# Patient Record
Sex: Male | Born: 1935 | Race: White | Hispanic: No | Marital: Married
Health system: Southern US, Community
[De-identification: ages and names within clinical notes are randomized; demographics above are authoritative.]

---

## 2010-11-25 ENCOUNTER — Ambulatory Visit: Payer: Self-pay | Admitting: Family Medicine

## 2011-08-13 ENCOUNTER — Inpatient Hospital Stay: Payer: Self-pay | Admitting: Internal Medicine

## 2011-08-13 LAB — COMPREHENSIVE METABOLIC PANEL
Albumin: 2.7 g/dL — ABNORMAL LOW (ref 3.4–5.0)
Anion Gap: 9 (ref 7–16)
BUN: 62 mg/dL — ABNORMAL HIGH (ref 7–18)
Calcium, Total: 8.4 mg/dL — ABNORMAL LOW (ref 8.5–10.1)
Chloride: 93 mmol/L — ABNORMAL LOW (ref 98–107)
EGFR (African American): 25 — ABNORMAL LOW
Glucose: 125 mg/dL — ABNORMAL HIGH (ref 65–99)
Osmolality: 289 (ref 275–301)
Potassium: 3.3 mmol/L — ABNORMAL LOW (ref 3.5–5.1)
SGOT(AST): 16 U/L (ref 15–37)

## 2011-08-13 LAB — URINALYSIS, COMPLETE
Bacteria: NONE SEEN
Blood: NEGATIVE
Ketone: NEGATIVE
Nitrite: NEGATIVE
RBC,UR: <1 /HPF (ref 0–5)
Specific Gravity: 1.015 (ref 1.003–1.030)
Squamous Epithelial: 1

## 2011-08-13 LAB — CBC
HCT: 35.5 % — ABNORMAL LOW (ref 40.0–52.0)
HGB: 11.5 g/dL — ABNORMAL LOW (ref 13.0–18.0)
MCV: 99 fL (ref 80–100)
RDW: 14.1 % (ref 11.5–14.5)
WBC: 10.6 10*3/uL (ref 3.8–10.6)

## 2011-08-13 LAB — PROTIME-INR
INR: 2
Prothrombin Time: 22.7 secs — ABNORMAL HIGH (ref 11.5–14.7)

## 2011-08-13 LAB — CK TOTAL AND CKMB (NOT AT ARMC): CK, Total: 57 U/L (ref 35–232)

## 2011-08-14 LAB — MAGNESIUM: Magnesium: 1.8 mg/dL

## 2011-08-14 LAB — BASIC METABOLIC PANEL
Anion Gap: 6 — ABNORMAL LOW (ref 7–16)
BUN: 43 mg/dL — ABNORMAL HIGH (ref 7–18)
Calcium, Total: 8.8 mg/dL (ref 8.5–10.1)
Chloride: 101 mmol/L (ref 98–107)
Co2: 30 mmol/L (ref 21–32)
Creatinine: 1.67 mg/dL — ABNORMAL HIGH (ref 0.60–1.30)
Osmolality: 284 (ref 275–301)
Potassium: 4 mmol/L (ref 3.5–5.1)

## 2012-02-28 ENCOUNTER — Emergency Department: Payer: Self-pay | Admitting: Unknown Physician Specialty

## 2012-02-28 LAB — CBC
HCT: 37.5 % — ABNORMAL LOW (ref 40.0–52.0)
Platelet: 210 10*3/uL (ref 150–440)
RBC: 3.92 10*6/uL — ABNORMAL LOW (ref 4.40–5.90)
RDW: 14.2 % (ref 11.5–14.5)
WBC: 8.5 10*3/uL (ref 3.8–10.6)

## 2012-02-28 LAB — BASIC METABOLIC PANEL
Anion Gap: 8 (ref 7–16)
Calcium, Total: 8.8 mg/dL (ref 8.5–10.1)
Chloride: 109 mmol/L — ABNORMAL HIGH (ref 98–107)
Co2: 24 mmol/L (ref 21–32)
Osmolality: 287 (ref 275–301)
Potassium: 3.6 mmol/L (ref 3.5–5.1)

## 2012-02-29 LAB — HEPATIC FUNCTION PANEL A (ARMC)
Bilirubin, Direct: 0.2 mg/dL (ref 0.00–0.20)
Bilirubin,Total: 0.5 mg/dL (ref 0.2–1.0)
SGOT(AST): 40 U/L — ABNORMAL HIGH (ref 15–37)

## 2012-02-29 LAB — MAGNESIUM: Magnesium: 1.5 mg/dL — ABNORMAL LOW

## 2012-02-29 LAB — PRO B NATRIURETIC PEPTIDE: B-Type Natriuretic Peptide: 1521 pg/mL — ABNORMAL HIGH (ref 0–450)

## 2012-02-29 LAB — PROTIME-INR: INR: 1.8

## 2012-03-21 ENCOUNTER — Emergency Department: Payer: Self-pay | Admitting: Emergency Medicine

## 2012-03-21 LAB — BASIC METABOLIC PANEL
Anion Gap: 4 — ABNORMAL LOW (ref 7–16)
BUN: 28 mg/dL — ABNORMAL HIGH (ref 7–18)
Chloride: 104 mmol/L (ref 98–107)
Co2: 29 mmol/L (ref 21–32)
Creatinine: 1.34 mg/dL — ABNORMAL HIGH (ref 0.60–1.30)
EGFR (Non-African Amer.): 51 — ABNORMAL LOW
Glucose: 103 mg/dL — ABNORMAL HIGH (ref 65–99)
Osmolality: 280 (ref 275–301)
Sodium: 137 mmol/L (ref 136–145)

## 2012-03-21 LAB — CBC
HCT: 44.4 % (ref 40.0–52.0)
MCV: 95 fL (ref 80–100)
Platelet: 294 10*3/uL (ref 150–440)
RBC: 4.69 10*6/uL (ref 4.40–5.90)
RDW: 13.9 % (ref 11.5–14.5)
WBC: 13 10*3/uL — ABNORMAL HIGH (ref 3.8–10.6)

## 2012-03-21 LAB — TROPONIN I: Troponin-I: 0.02 ng/mL

## 2012-03-21 LAB — CK TOTAL AND CKMB (NOT AT ARMC): CK, Total: 69 U/L (ref 35–232)

## 2012-03-22 LAB — PROTIME-INR
INR: 1.6
Prothrombin Time: 19.4 secs — ABNORMAL HIGH (ref 11.5–14.7)

## 2012-03-22 LAB — PRO B NATRIURETIC PEPTIDE: B-Type Natriuretic Peptide: 539 pg/mL — ABNORMAL HIGH (ref 0–450)

## 2012-03-28 ENCOUNTER — Ambulatory Visit: Payer: Self-pay | Admitting: Internal Medicine

## 2012-03-28 LAB — PROTIME-INR: INR: 1.1

## 2012-03-28 LAB — CK TOTAL AND CKMB (NOT AT ARMC): CK, Total: 42 U/L (ref 35–232)

## 2012-03-29 LAB — BASIC METABOLIC PANEL
Anion Gap: 10 (ref 7–16)
BUN: 22 mg/dL — ABNORMAL HIGH (ref 7–18)
Chloride: 104 mmol/L (ref 98–107)
Co2: 25 mmol/L (ref 21–32)
Creatinine: 0.95 mg/dL (ref 0.60–1.30)
EGFR (African American): >60
EGFR (Non-African Amer.): >60
Osmolality: 281 (ref 275–301)
Potassium: 3.8 mmol/L (ref 3.5–5.1)

## 2012-05-18 ENCOUNTER — Inpatient Hospital Stay: Payer: Self-pay | Admitting: Internal Medicine

## 2012-05-18 LAB — PROTIME-INR
INR: 1.8
Prothrombin Time: 20.5 secs — ABNORMAL HIGH (ref 11.5–14.7)

## 2012-05-18 LAB — URINALYSIS, COMPLETE
Bilirubin,UR: NEGATIVE
Glucose,UR: NEGATIVE mg/dL (ref 0–75)
Ketone: NEGATIVE
Nitrite: NEGATIVE
Ph: 6 (ref 4.5–8.0)
Protein: NEGATIVE
RBC,UR: 2 /HPF (ref 0–5)

## 2012-05-18 LAB — CBC
HGB: 14.4 g/dL (ref 13.0–18.0)
MCHC: 32.8 g/dL (ref 32.0–36.0)
Platelet: 241 10*3/uL (ref 150–440)
RDW: 15.2 % — ABNORMAL HIGH (ref 11.5–14.5)

## 2012-05-18 LAB — LIPASE, BLOOD: Lipase: 376 U/L (ref 73–393)

## 2012-05-18 LAB — COMPREHENSIVE METABOLIC PANEL
Alkaline Phosphatase: 114 U/L (ref 50–136)
EGFR (African American): >60
EGFR (Non-African Amer.): >60
Glucose: 108 mg/dL — ABNORMAL HIGH (ref 65–99)
Osmolality: 286 (ref 275–301)
SGOT(AST): 52 U/L — ABNORMAL HIGH (ref 15–37)
SGPT (ALT): 42 U/L (ref 12–78)
Sodium: 138 mmol/L (ref 136–145)
Total Protein: 8.1 g/dL (ref 6.4–8.2)

## 2012-05-18 LAB — CK TOTAL AND CKMB (NOT AT ARMC): CK-MB: 5.1 ng/mL — ABNORMAL HIGH (ref 0.5–3.6)

## 2012-05-18 LAB — APTT: Activated PTT: 36.7 secs — ABNORMAL HIGH (ref 23.6–35.9)

## 2012-05-18 LAB — CK: CK, Total: 190 U/L (ref 35–232)

## 2012-05-18 LAB — TROPONIN I: Troponin-I: 0.04 ng/mL

## 2012-05-18 LAB — PRO B NATRIURETIC PEPTIDE: B-Type Natriuretic Peptide: 753 pg/mL — ABNORMAL HIGH (ref 0–450)

## 2012-05-19 LAB — CBC WITH DIFFERENTIAL/PLATELET
Eosinophil %: 2.1 %
HCT: 43 % (ref 40.0–52.0)
HGB: 14.6 g/dL (ref 13.0–18.0)
Lymphocyte #: 1.8 10*3/uL (ref 1.0–3.6)
Lymphocyte %: 18.1 %
MCH: 31.4 pg (ref 26.0–34.0)
MCHC: 33.9 g/dL (ref 32.0–36.0)
Monocyte #: 0.9 x10 3/mm (ref 0.2–1.0)
Monocyte %: 9.6 %
Neutrophil #: 6.8 10*3/uL — ABNORMAL HIGH (ref 1.4–6.5)
Neutrophil %: 69.4 %
RBC: 4.64 10*6/uL (ref 4.40–5.90)
RDW: 15 % — ABNORMAL HIGH (ref 11.5–14.5)
WBC: 9.7 10*3/uL (ref 3.8–10.6)

## 2012-05-19 LAB — BASIC METABOLIC PANEL
BUN: 36 mg/dL — ABNORMAL HIGH (ref 7–18)
Chloride: 102 mmol/L (ref 98–107)
Osmolality: 283 (ref 275–301)
Potassium: 4.1 mmol/L (ref 3.5–5.1)

## 2012-05-20 LAB — URINE CULTURE

## 2012-06-06 ENCOUNTER — Inpatient Hospital Stay: Payer: Self-pay | Admitting: Internal Medicine

## 2012-06-06 LAB — COMPREHENSIVE METABOLIC PANEL
Albumin: 4 g/dL (ref 3.4–5.0)
BUN: 41 mg/dL — ABNORMAL HIGH (ref 7–18)
Calcium, Total: 9.2 mg/dL (ref 8.5–10.1)
Creatinine: 1.56 mg/dL — ABNORMAL HIGH (ref 0.60–1.30)
EGFR (African American): 49 — ABNORMAL LOW
EGFR (Non-African Amer.): 43 — ABNORMAL LOW
Glucose: 66 mg/dL (ref 65–99)
Potassium: 4.6 mmol/L (ref 3.5–5.1)
SGOT(AST): 42 U/L — ABNORMAL HIGH (ref 15–37)
SGPT (ALT): 35 U/L (ref 12–78)
Sodium: 133 mmol/L — ABNORMAL LOW (ref 136–145)

## 2012-06-06 LAB — PROTIME-INR: Prothrombin Time: 17.7 secs — ABNORMAL HIGH (ref 11.5–14.7)

## 2012-06-06 LAB — CBC WITH DIFFERENTIAL/PLATELET
Basophil #: 0.1 10*3/uL (ref 0.0–0.1)
Basophil %: 0.9 %
Eosinophil %: 0.8 %
HCT: 40.9 % (ref 40.0–52.0)
Lymphocyte #: 1.3 10*3/uL (ref 1.0–3.6)
Lymphocyte %: 13.3 %
MCH: 29.9 pg (ref 26.0–34.0)
Monocyte %: 8.3 %
Neutrophil %: 76.7 %
Platelet: 205 10*3/uL (ref 150–440)
RBC: 4.48 10*6/uL (ref 4.40–5.90)
RDW: 15.1 % — ABNORMAL HIGH (ref 11.5–14.5)
WBC: 10 10*3/uL (ref 3.8–10.6)

## 2012-06-06 LAB — CBC
HCT: 43.5 % (ref 40.0–52.0)
MCH: 29.4 pg (ref 26.0–34.0)
RBC: 4.78 10*6/uL (ref 4.40–5.90)
RDW: 14.7 % — ABNORMAL HIGH (ref 11.5–14.5)
WBC: 9.4 10*3/uL (ref 3.8–10.6)

## 2012-06-06 LAB — TSH: Thyroid Stimulating Horm: 5.24 u[IU]/mL — ABNORMAL HIGH

## 2012-06-06 LAB — URINALYSIS, COMPLETE
Blood: NEGATIVE
Ketone: NEGATIVE
Leukocyte Esterase: NEGATIVE
Ph: 7 (ref 4.5–8.0)
Protein: NEGATIVE
Squamous Epithelial: NONE SEEN

## 2012-06-06 LAB — APTT
Activated PTT: 33.7 secs (ref 23.6–35.9)
Activated PTT: 37.6 secs — ABNORMAL HIGH (ref 23.6–35.9)

## 2012-06-06 LAB — TROPONIN I
Troponin-I: 0.03 ng/mL
Troponin-I: 0.04 ng/mL

## 2012-06-06 LAB — MAGNESIUM: Magnesium: 1.8 mg/dL

## 2012-06-07 LAB — CBC WITH DIFFERENTIAL/PLATELET
Basophil #: 0.1 10*3/uL (ref 0.0–0.1)
Basophil %: 0.8 %
Eosinophil #: 0.3 10*3/uL (ref 0.0–0.7)
Eosinophil %: 2.8 %
HGB: 13 g/dL (ref 13.0–18.0)
MCV: 92 fL (ref 80–100)
Neutrophil #: 6.5 10*3/uL (ref 1.4–6.5)
Neutrophil %: 68.3 %
RBC: 4.37 10*6/uL — ABNORMAL LOW (ref 4.40–5.90)
RDW: 15 % — ABNORMAL HIGH (ref 11.5–14.5)
WBC: 9.5 10*3/uL (ref 3.8–10.6)

## 2012-06-07 LAB — COMPREHENSIVE METABOLIC PANEL
Albumin: 3.5 g/dL (ref 3.4–5.0)
BUN: 43 mg/dL — ABNORMAL HIGH (ref 7–18)
Bilirubin,Total: 1 mg/dL (ref 0.2–1.0)
Chloride: 102 mmol/L (ref 98–107)
Glucose: 92 mg/dL (ref 65–99)
Osmolality: 282 (ref 275–301)
Potassium: 4.6 mmol/L (ref 3.5–5.1)
SGOT(AST): 34 U/L (ref 15–37)
SGPT (ALT): 30 U/L (ref 12–78)
Total Protein: 7.1 g/dL (ref 6.4–8.2)

## 2012-06-07 LAB — LIPID PANEL
Cholesterol: 93 mg/dL (ref 0–200)
Triglycerides: 59 mg/dL (ref 0–200)
VLDL Cholesterol, Calc: 12 mg/dL (ref 5–40)

## 2012-06-07 LAB — TROPONIN I: Troponin-I: 0.04 ng/mL

## 2012-06-07 LAB — SEDIMENTATION RATE: Erythrocyte Sed Rate: 4 mm/hr (ref 0–20)

## 2012-06-08 LAB — BASIC METABOLIC PANEL
Calcium, Total: 9.2 mg/dL (ref 8.5–10.1)
Co2: 28 mmol/L (ref 21–32)
Creatinine: 1.33 mg/dL — ABNORMAL HIGH (ref 0.60–1.30)
Glucose: 95 mg/dL (ref 65–99)
Sodium: 133 mmol/L — ABNORMAL LOW (ref 136–145)

## 2012-06-08 LAB — PROTIME-INR
INR: 1.4
Prothrombin Time: 16.8 secs — ABNORMAL HIGH (ref 11.5–14.7)

## 2014-03-15 IMAGING — CR DG CHEST 2V
1 series · 2 of 2 positions shown · non-contrast
Comparison: none

REASON FOR EXAM: SOB
COMMENTS:

PROCEDURE:     DXR - DXR CHEST PA (OR AP) AND LATERAL  - February 28, 2012 [DATE]
RESULT:     Cardiomegaly. Atelectasis versus pneumonia left lung base. These
findings are new from prior study of 08/13/2011.

[Series 1: w chest pa · 0.14mm/px · 2 of 2 slices shown]
[im 1/2]
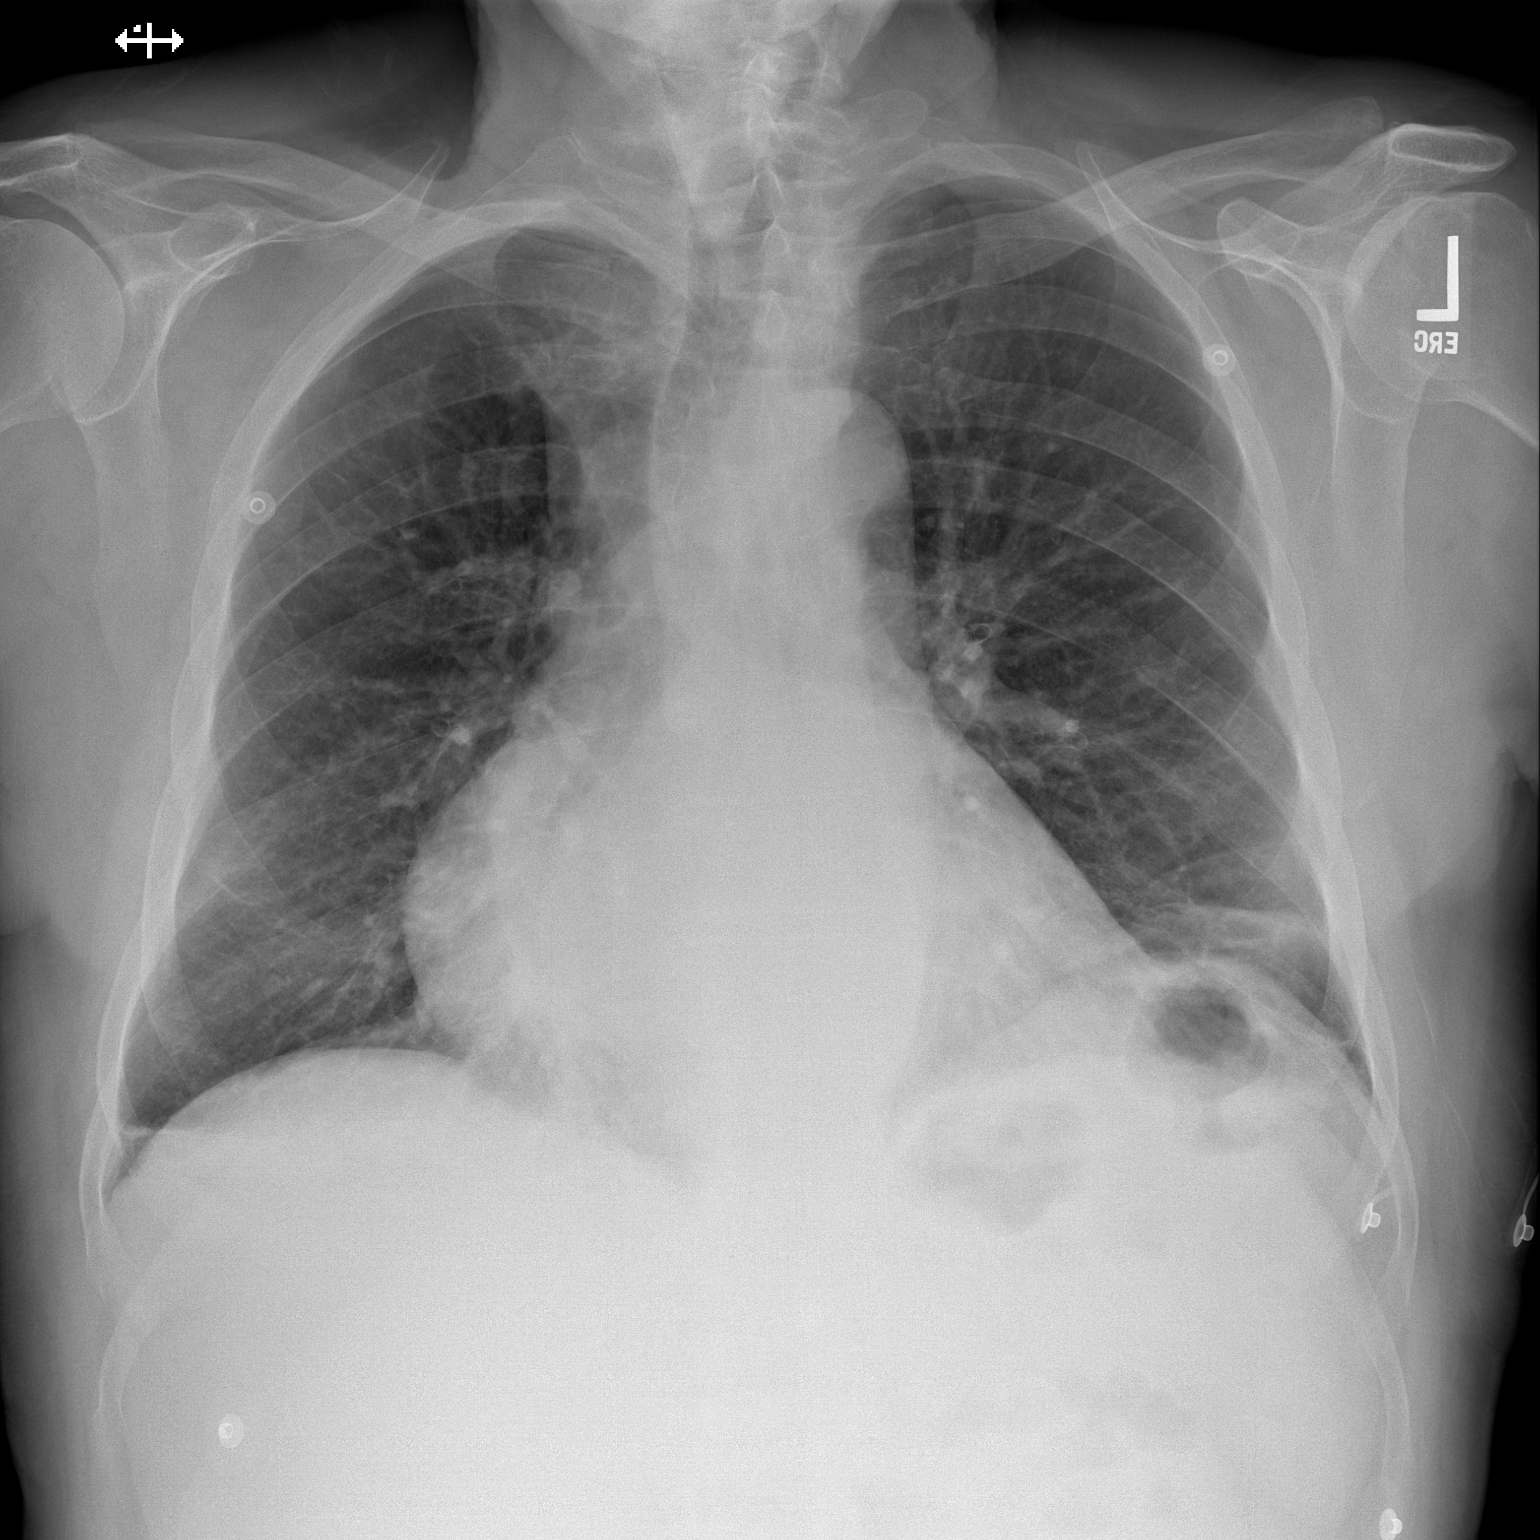
[im 2/2]
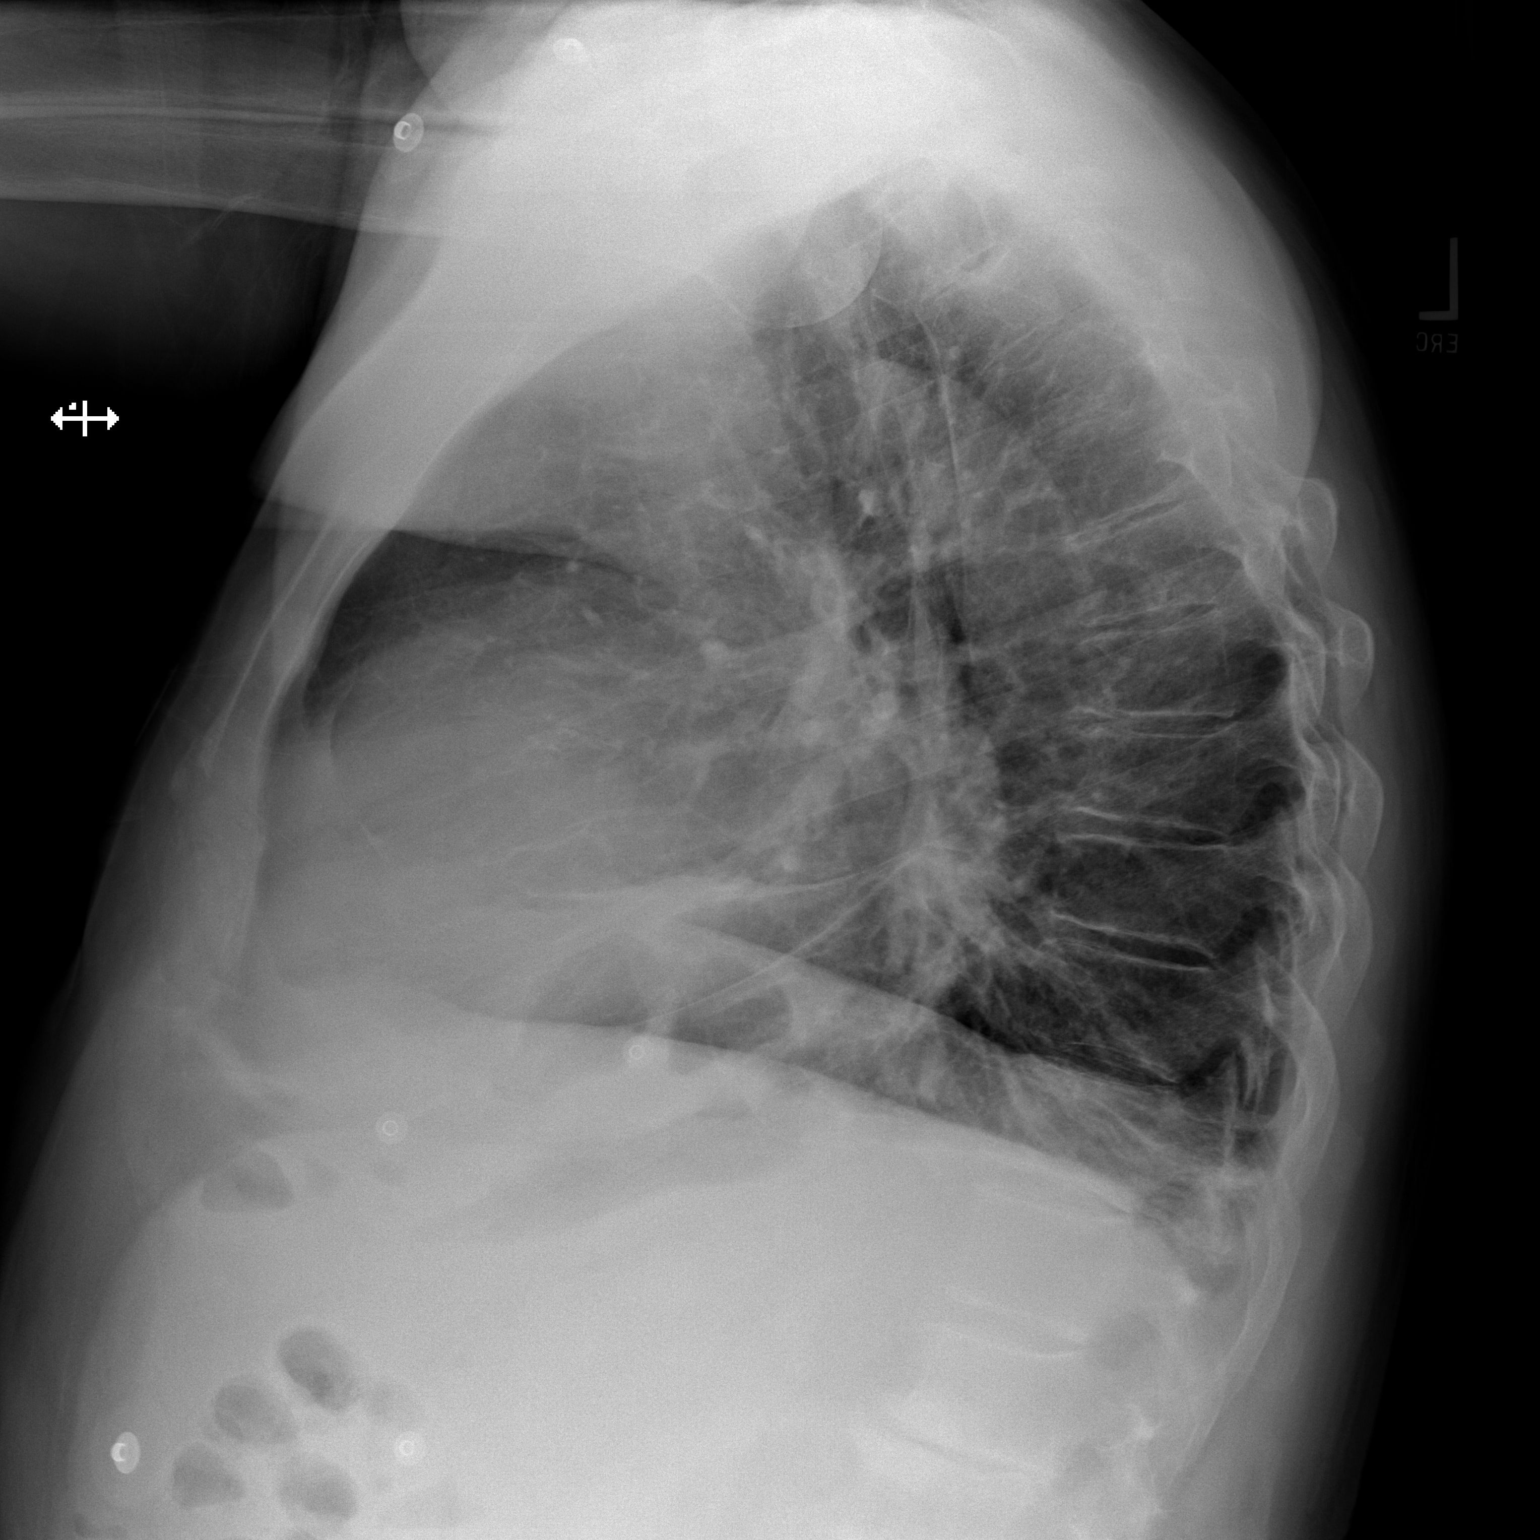

[2 of 2 positions shown; findings below may reference images not displayed]

IMPRESSION: Atelectasis versus pneumonia left lung base.

## 2014-03-16 IMAGING — CT CT CHEST W/ CM
2 series · 15 of 31 positions shown, 19 images · IV contrast (APPLIED)
Comparison: none

REASON FOR EXAM: sob high d dimer
COMMENTS:

[Series 4: soft tissue · axial · 0.77mm/px · z∈[-326,-284]mm · 2 of 94 slices shown]
[im 8/94  mediastinal]
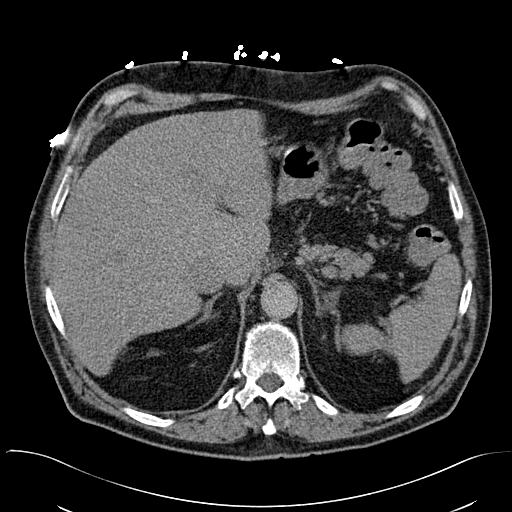
[im 22/94  mediastinal]
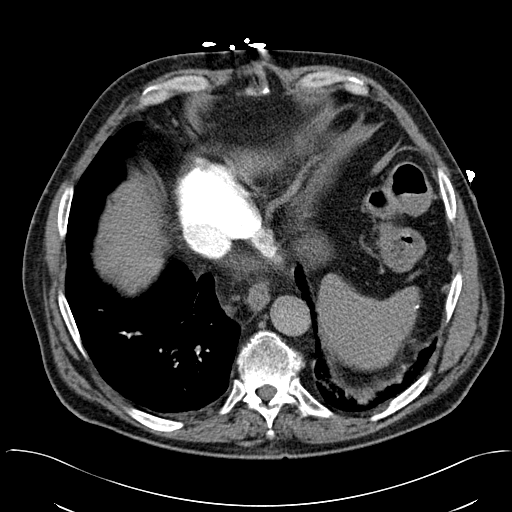

[Series 5: lung windows · axial · 0.77mm/px · z∈[-320,-89]mm · 13 of 92 slices shown, 17 images]
[im 8/92  mediastinal]
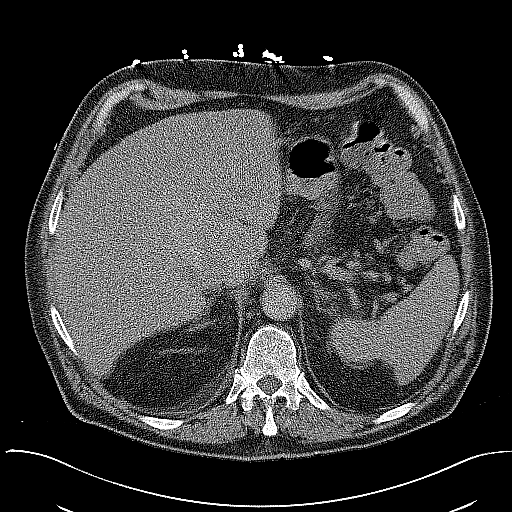
[im 8/92  lung]
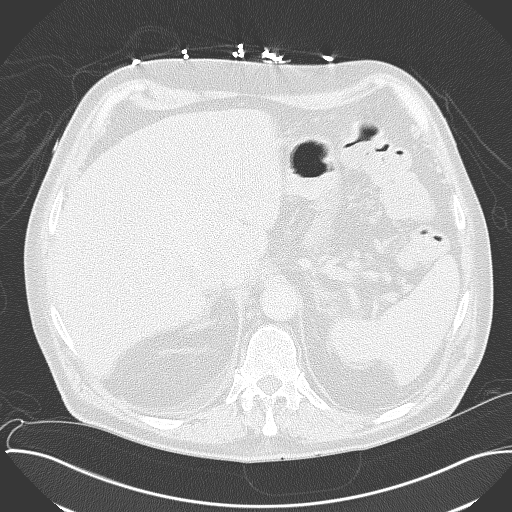
[im 15/92  lung]
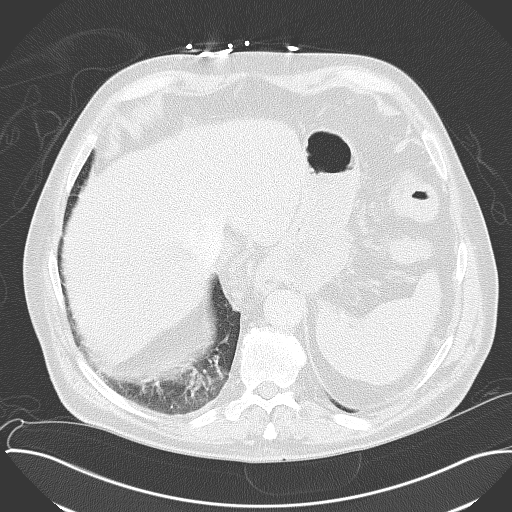
[im 22/92  lung]
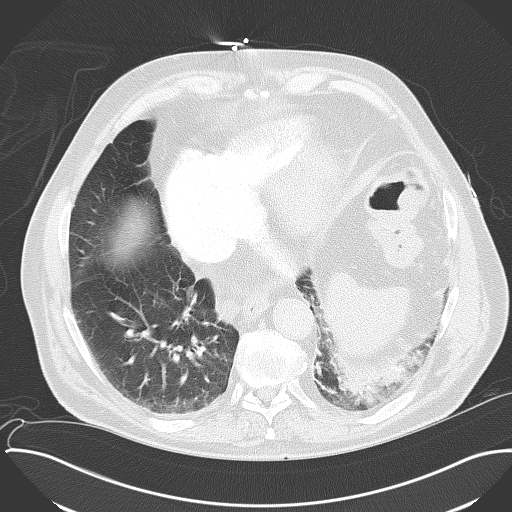
[im 29/92  lung]
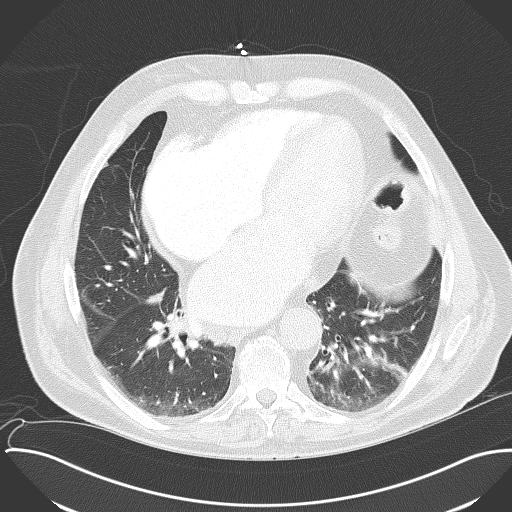
[im 36/92  mediastinal]
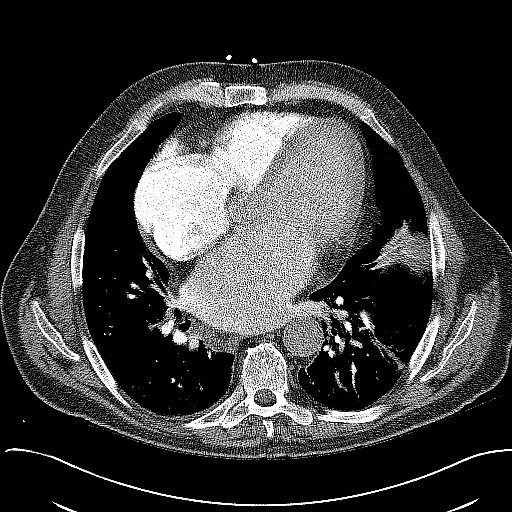
[im 36/92  lung]
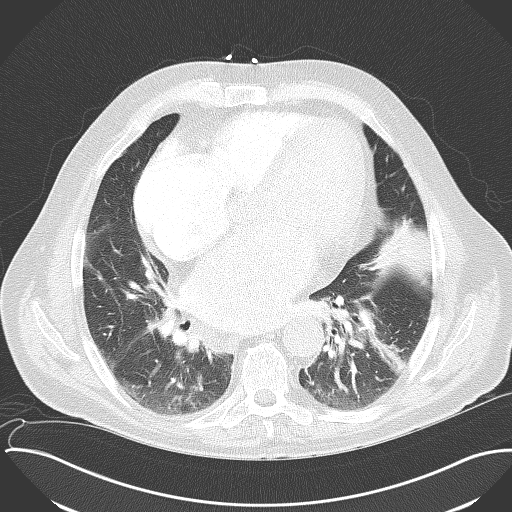
[im 43/92  lung]
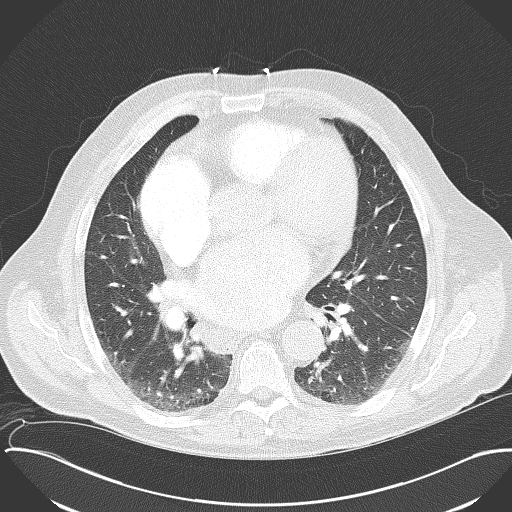
[im 46/92  lung]
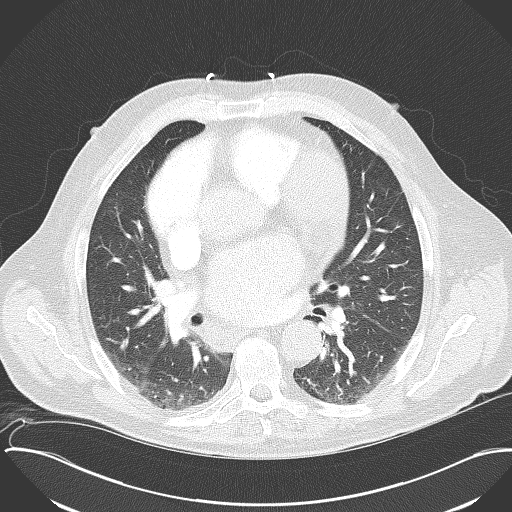
[im 50/92  lung]
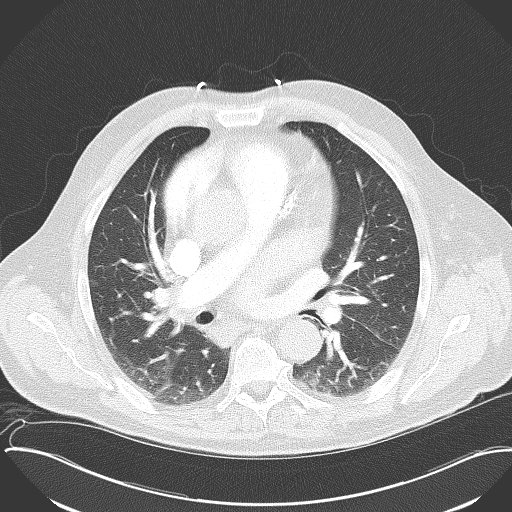
[im 57/92  mediastinal]
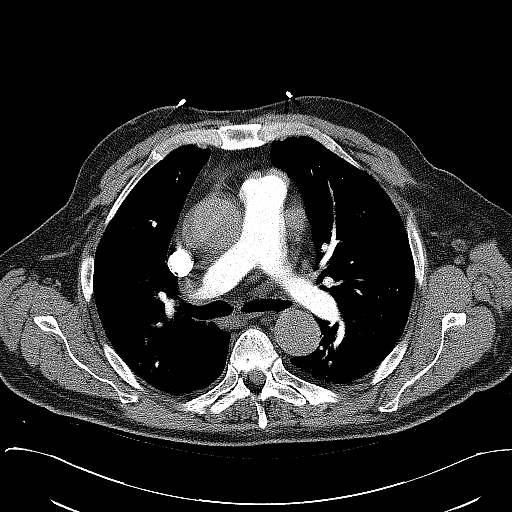
[im 57/92  lung]
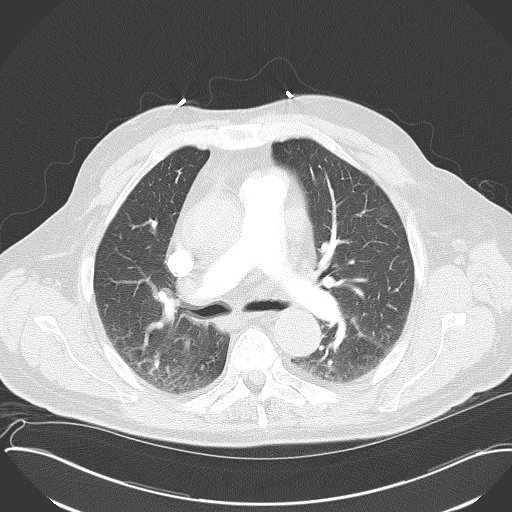
[im 64/92  lung]
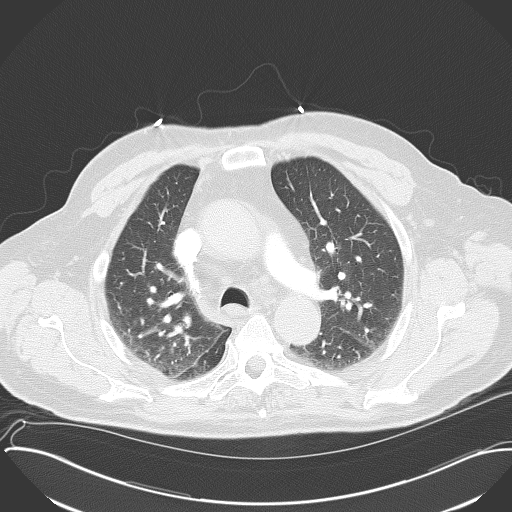
[im 71/92  lung]
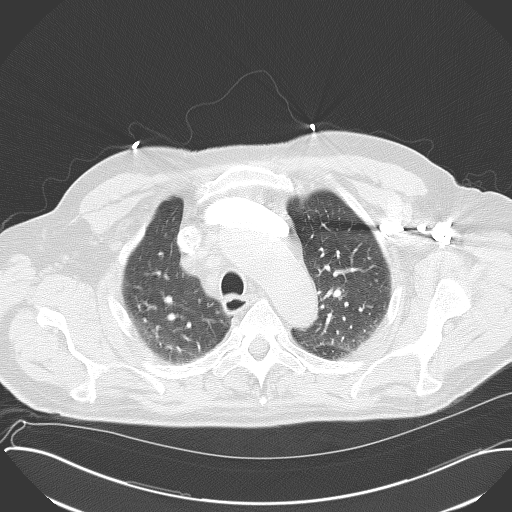
[im 78/92  lung]
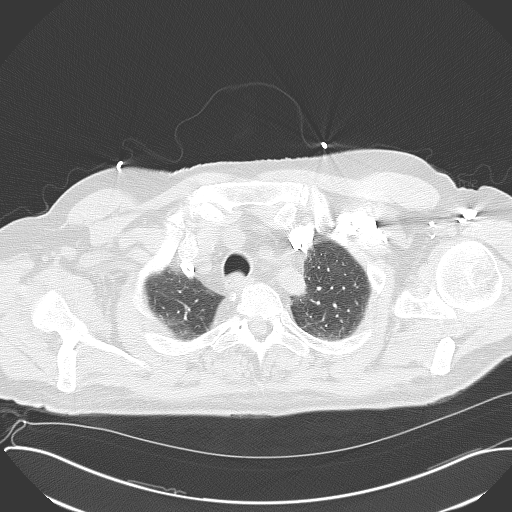
[im 85/92  mediastinal]
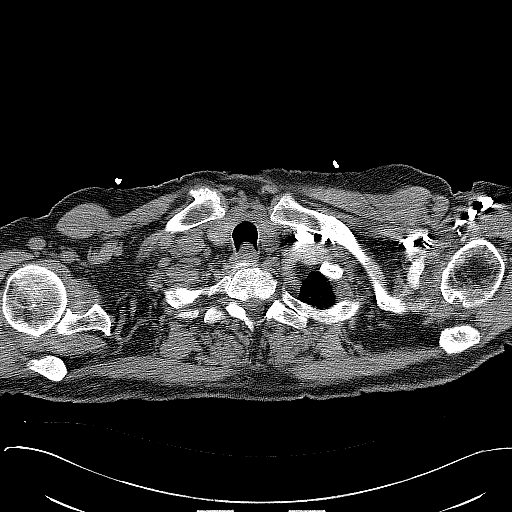
[im 85/92  lung]
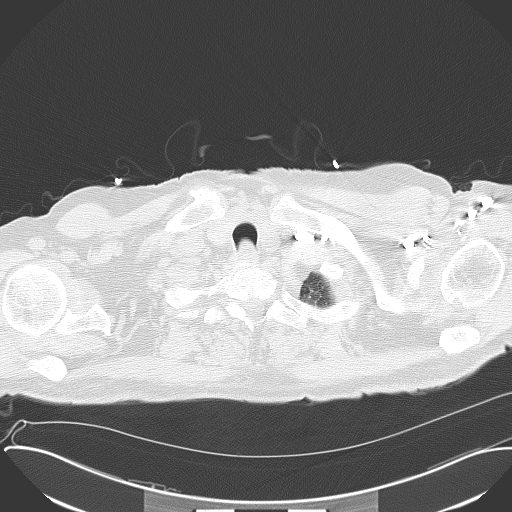

[15 of 31 positions shown; findings below may reference images not displayed]

PROCEDURE:     CT  - CT CHEST (FOR PE) W  - February 29, 2012  [DATE]

RESULT:     Emergent chest CT is performed with 80 mL of Hsovue-JTF
iodinated intravenous contrast with images reconstructed at 3 mm slice
thickness in the axial plane. The patient has no previous exam for
comparison.

Images are degraded somewhat by respiratory motion artifact in the lower
lungs. There is evidence of early bronchiectasis in the lower lobe regions
with some mild peribronchial thickening suggestive of bronchitis. Some areas
of consolidation in the lower lobes could represent atelectasis. Infiltrate
is felt to be somewhat less likely. The heart is enlarged with prominent
biatrial enlargement. There is excellent opacification of the pulmonary
arteries without evidence of pulmonary emboli. The thoracic aorta is normal
in caliber. Patchy groundglass areas of density are seen in the lungs which
could represent atelectasis or edema. Small hiatal hernia is present.
Granulomatous calcification is seen within the spleen. The adrenal glands
appear unremarkable.
IMPRESSION: 1. No pulmonary emboli evident.
2. Cardiomegaly.
3. Bilateral lung base areas of atelectasis with mild areas of possible
edema.
4. Small hiatal hernia.
5. Splenic granulomatous calcifications.

[REDACTED]

## 2014-04-06 IMAGING — CR DG CHEST 2V
1 series · 2 of 2 positions shown · non-contrast
Comparison: none

REASON FOR EXAM: SOB
COMMENTS:

PROCEDURE:     DXR - DXR CHEST PA (OR AP) AND LATERAL  - March 21, 2012 [DATE]
RESULT:     Comparison: 02/28/2012

[Series 1: w chest pa · 0.14mm/px · 2 of 2 slices shown]
[im 1/2]
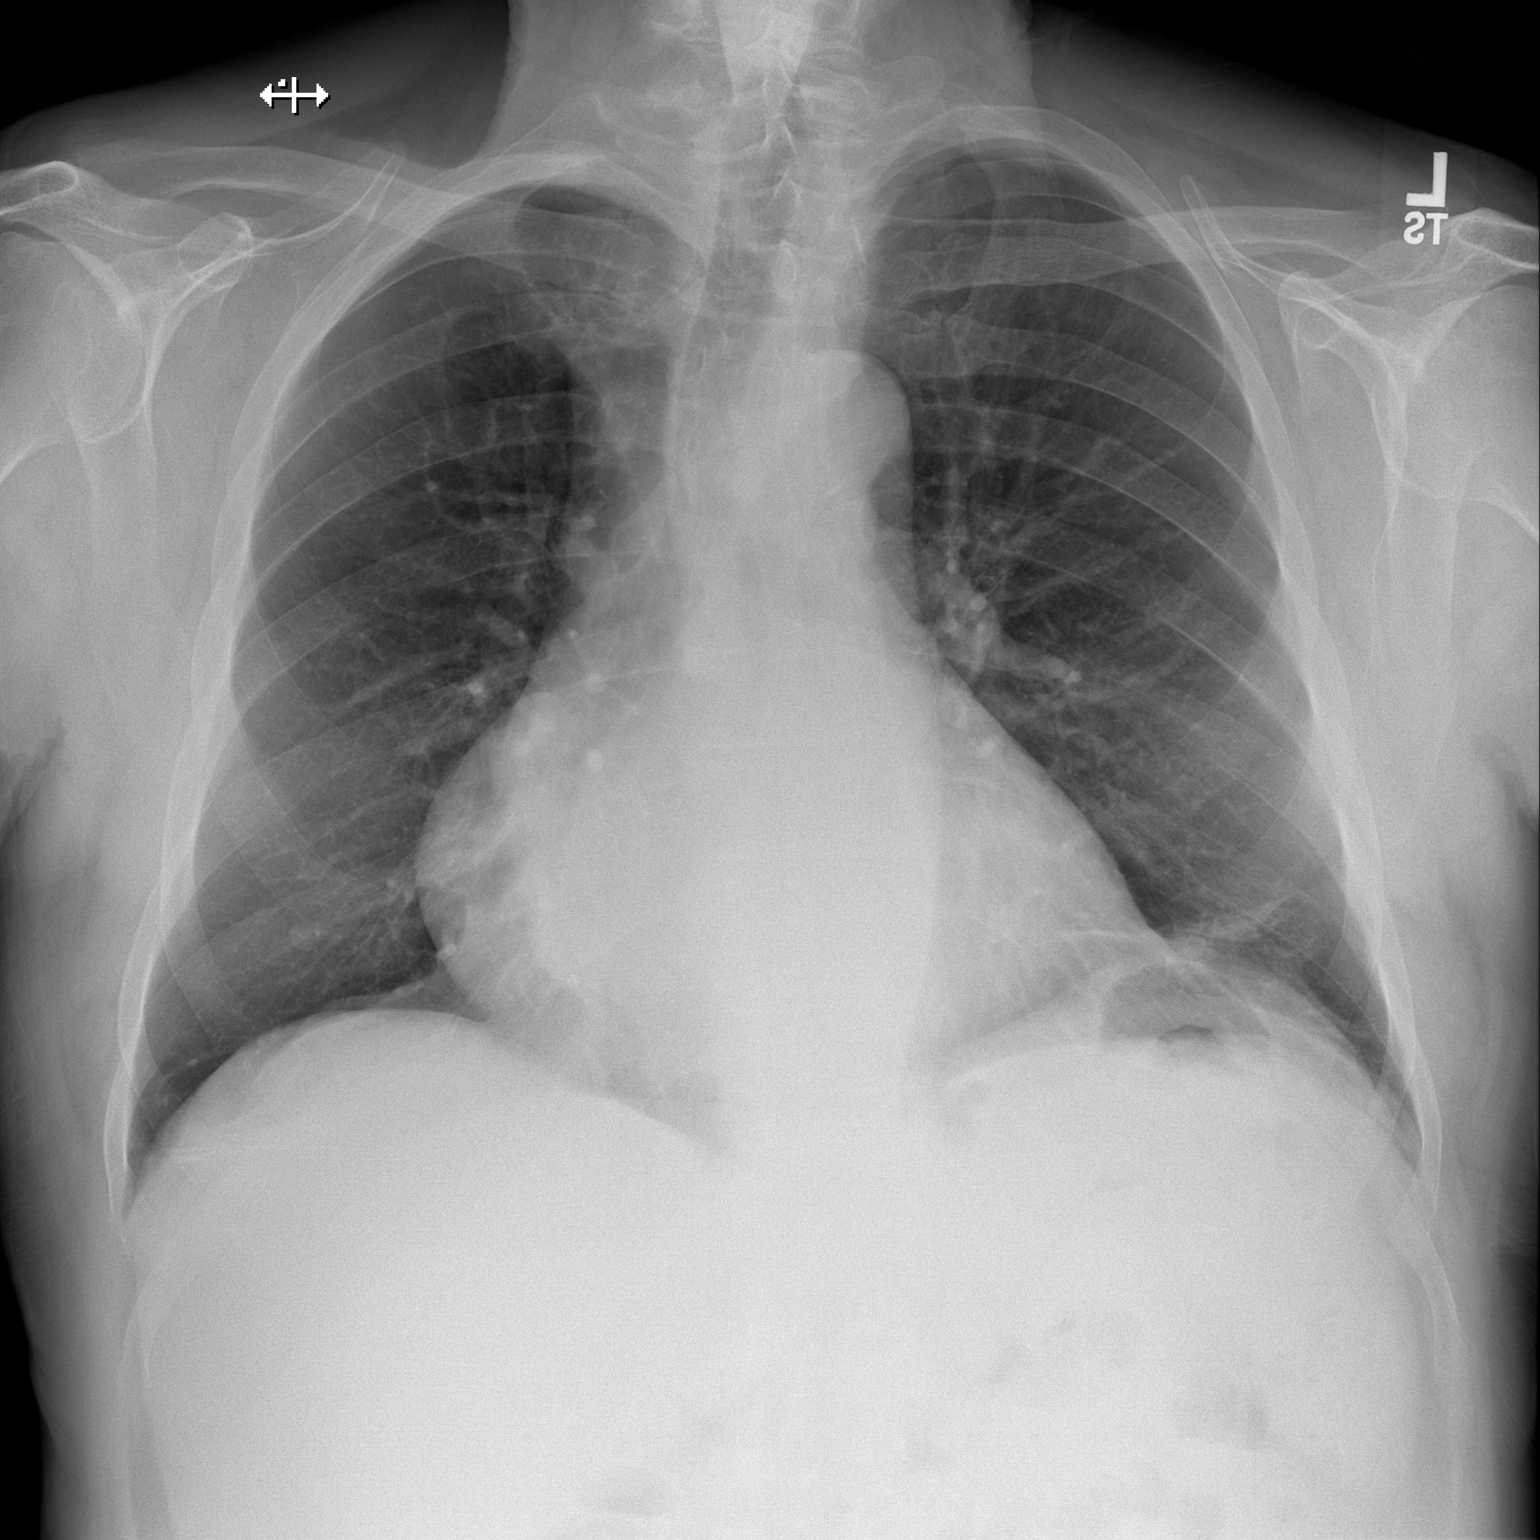
[im 2/2]
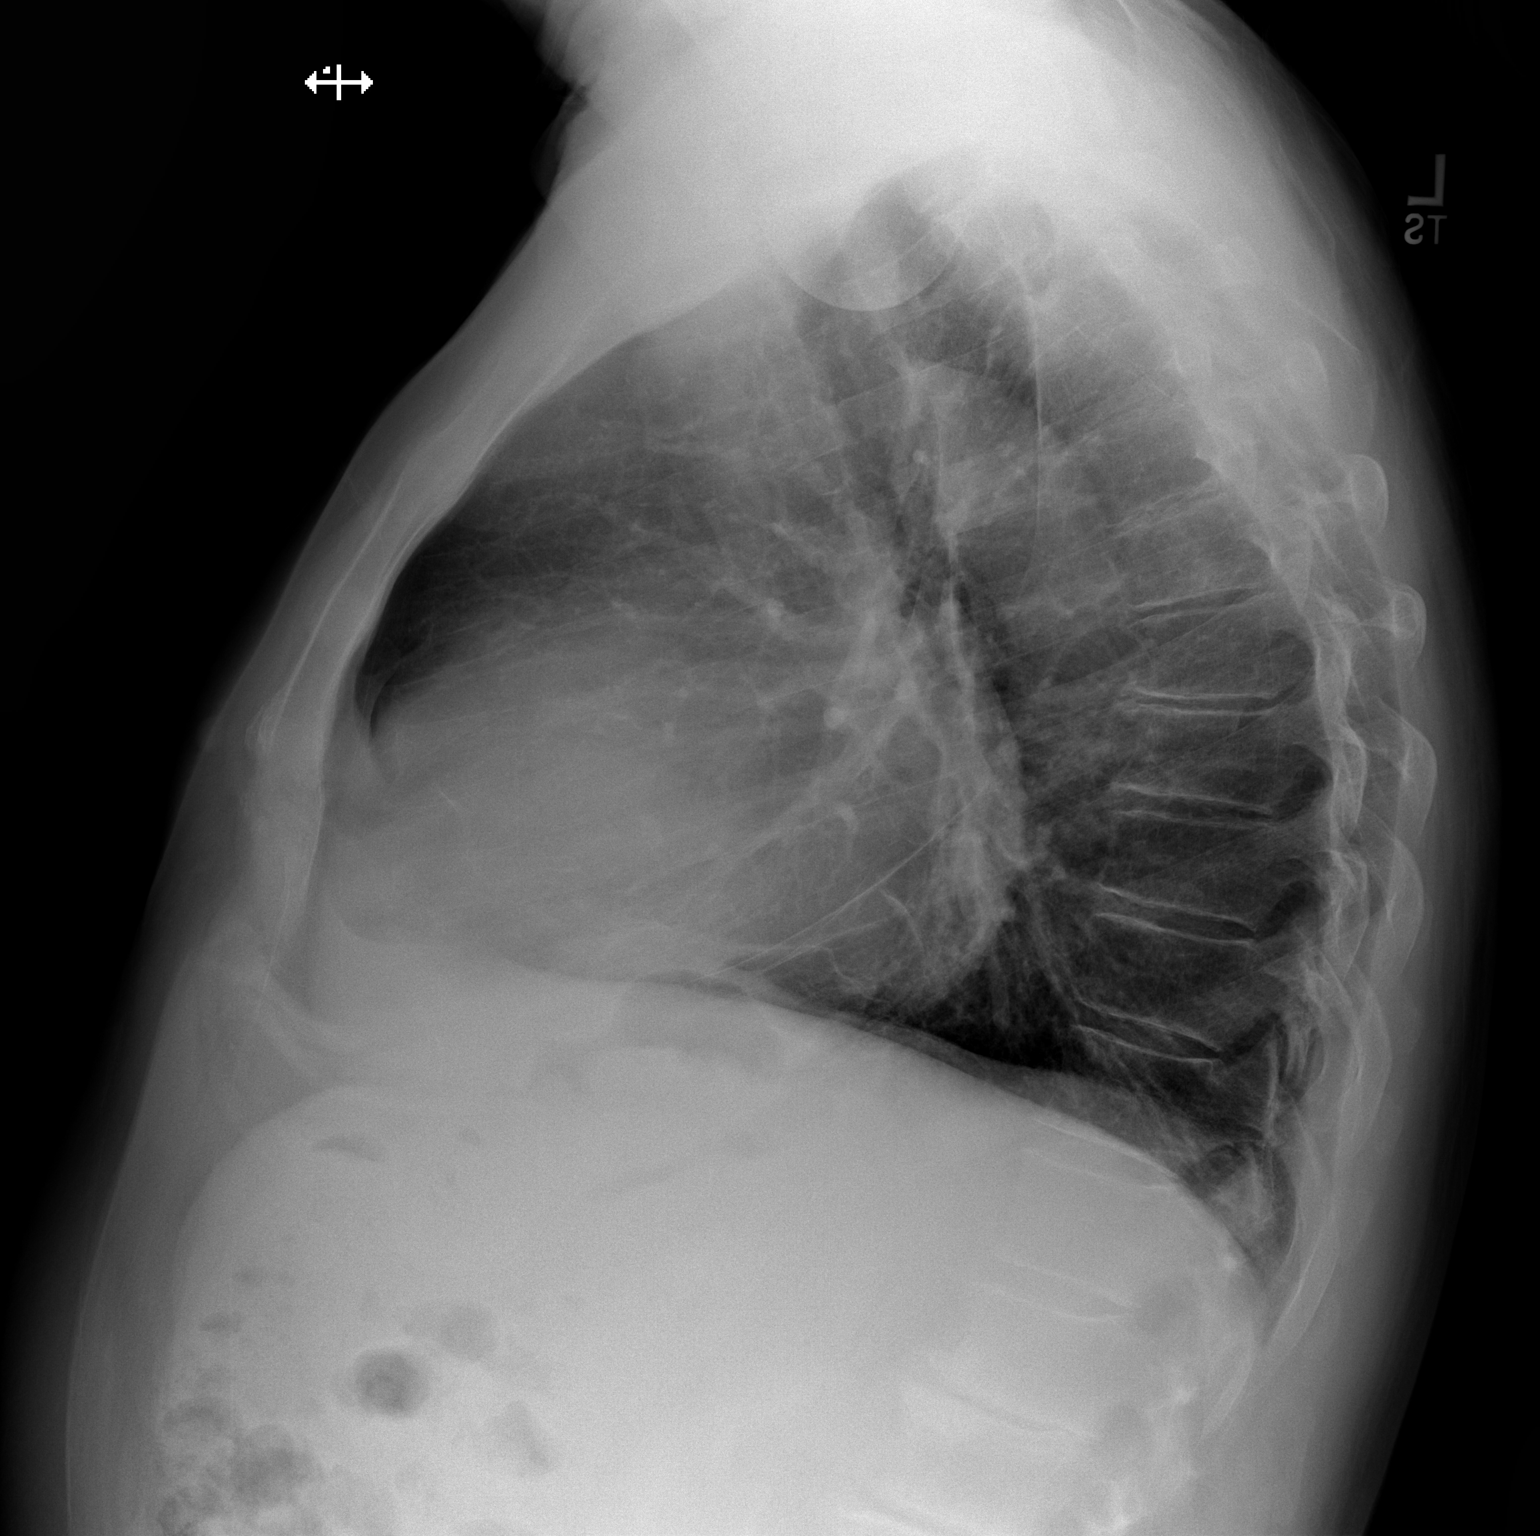

[2 of 2 positions shown; findings below may reference images not displayed]

FINDINGS: Cardiomegaly and the mediastinum are similar to prior. Minimal linear
opacities at the left lung base are likely secondary to atelectasis or
scarring, similar to prior. Small, symmetric nodular densities overlying the
lower lungs are felt to represent nipple shadows.
IMPRESSION: No acute cardiopulmonary disease.

[REDACTED]

## 2014-06-21 NOTE — Discharge Summary (Signed)
PATIENT NAME:  Tristan Woods, Tristan Woods MR#:  [id number] DATE OF BIRTH:  February 05, 1935  DATE OF ADMISSION:  03/28/2012 DATE OF DISCHARGE:  03/29/2012  DISCHARGE DIAGNOSES: 1. Moderate congestive heart failure.  2. Coronary artery disease.  3. Stable angina.  4. Hypertension.   HISTORY: This is a 80 year old male with new onset of congestive heart failure and pressure in his chest consistent with angina.  The patient had a stress test showing inferior myocardial perfusion defect and myocardial ischemia. He was placed on appropriate medication management, including metoprolol for cardiomyopathy and Lasix as well. The patient underwent cardiac catheterization showing moderate LV systolic dysfunction with normal pulmonary pressures and normal pulmonary capillary wedge pressures but significant distal right coronary artery stenosis consistent with inferior myocardial ischemia.  The patient underwent PCI and stent placement of a Bare Metal Stent  due to the fact that the patient has had atrial fibrillation and will need triple therapy. The patient has had no further significant symptoms. The patient is ambulating quite well after his procedure without complications. He was discharged home in good condition with followup in 2 weeks.    DISCHARGE MEDICATIONS: 1. Metoprolol 25 mg twice per day.  2. Aspirin 25 mg p.o. daily.  3. Coumadin 4 mg each day. 4. Plavix 75 mg each day.   FOLLOWUP: In 2 weeks.  ____________________________ Lamar BlinksBruce J. Harjot Dibello, MD bjk:cb D: 03/29/2012 11:14:22 ET T: 03/29/2012 11:46:56 ET JOB#: [id number] cc: Lamar BlinksBruce J. Devan Babino, MD, <Dictator> Lamar BlinksBRUCE J Esaw Knippel MD ELECTRONICALLY SIGNED 03/30/2012 13:42

## 2014-06-21 NOTE — Consult Note (Signed)
Discussed clinical findings and plan with Abby PotashKaren Earle, PA and agree with her note from today.  Electronic Signatures: Scot JunElliott, Robert T (MD)  (Signed on 21-Mar-14 17:42)  Authored  Last Updated: 21-Mar-14 17:42 by Scot JunElliott, Robert T (MD)

## 2014-06-21 NOTE — Consult Note (Signed)
PATIENT NAME:  Tristan Woods, STATES Tristan Woods DATE OF BIRTH:  08/12/1934  DATE OF CONSULTATION:  05/18/2012  REFERRING PHYSICIAN:  Dr. Elisabeth Pigeon  CONSULTING PHYSICIAN:  Tristan Shackleton. Daylan Boggess, PA-C  CARDIOLOGIST:  Dr.  Gwen Pounds    REASON FOR CONSULTATION: Heme-positive stool with a patient on Coumadin therapy.   HISTORY OF PRESENT ILLNESS: This is a pleasant 80 year old gentleman who initially presented to the Emergency Department with concerns of worsening shortness of breath and lower extremity edema. Who does have a past medical history significant for recent cardiac stent placement in January 2014. He states that prior to the stent placement, he was having similar symptoms, during which time there was a positive stress test and he went in for cardiac catheter and stent placement. He had been doing well over the past 2 months, up until the past 3 days where he began noticing increasing shortness of breath. Upon further work-up, his CPK was mildly elevated at 6.3 and his BNP was elevated at 753. Troponins and CK were negative. His hemoglobin was stable at 14.4 and his INR was 1.8. He does have a history of atrial fibrillation as well for which he is on Coumadin therapy at home. He is on Plavix for his stent placement. Incidental finding in the Emergency Room was urinary tract infection. He subsequently underwent a rectal exam during which time he was found to be heme-positive. He denies noticing any bright red blood per rectum or melena. He denies any diarrhea or constipation. He denies any abdominal pain rectal pain, nausea or vomiting. He has not noticed any gross bleeding of any kind. He states his last colonoscopy was between 10 and 12 years ago and states that it unremarkable at that time. Currently the patient states his shortness of breath has improved since admission. He has no current symptomatic concerns or complaints. He denies any NSAID use at home. No fever or chills. No chest pain or shortness of  breath.   ALLERGIES: No known drug allergies.   PAST MEDICAL HISTORY: Atrial fibrillation, hypertension, dyslipidemia, gout, congestive heart failure, coronary artery disease and a history of penile cancer, status post surgery and chemoradiation.   PAST SURGICAL HISTORY: Cardiac stent placement recently in January 2014. Also history of a partial penectomy,  status post chemotherapy, radiation and surgery.   SOCIAL HISTORY: The patient denies any alcohol, tobacco or illicit drug use.   FAMILY HISTORY: The patient states his brother had cancer, which he thinks was lung, but is not positive. No known family history of GI malignancy, colon polyps or IBD.   HOME MEDICATIONS: Metoprolol, Coumadin, allopurinol, Plavix, Lasix and atorvastatin.   INPATIENT MEDICATIONS: Pantoprazole, atorvastatin, Rocephin, clopidogrel, Toprol-XL, allopurinol, furosemide.   REVIEW OF SYSTEMS: A 12-system review of systems was obtained on the patient. All pertinent positives are mentioned above and are otherwise negative.   PHYSICAL EXAMINATION:    VITAL SIGNS: Blood pressure 121/78, pulse 108, respirations 20, temperature 97.4, bedside pulse oximetry 95%.  GENERAL: This is a pleasant 80 year old gentleman resting quietly and comfortably in bed accompanied by his son at bedside in no acute distress. Alert and oriented x 3,  HEAD: Atraumatic, normocephalic.  NECK: Supple.  HEENT: Sclerae anicteric. Mucous membranes moist.  PULMONARY: Respirations are even and unlabored, clear to auscultation in bilateral anterior lung fields.  HEART: Irregularly irregular rhythm. S1 and S2 noted.  ABDOMEN: Soft, nontender, nondistended. Normoactive bowel sounds noted in all 4 quadrants. No masses palpated. No guarding or rebound. No organomegaly or  hernias appreciated.  RECTAL: Deferred. He recently had a rectal exam done in the ER that was heme-positive.  EXTREMITIES: Mild lower extremity pitting edema. He currently has stockings  on. 2+ pulses noted bilateral upper extremity. NEUROLOGIC:  Cranial nerves II through XII grossly intact. Alert and oriented x 3.  PSYCHIATRIC:  Appropriate mood and affect.   LABORATORY DATA: White blood cells 9.9, hemoglobin 14.4, hematocrit 43.8, platelets 241, sodium 138, potassium 3.8, BUN 41, creatinine 1.15, glucose 108, troponin 0.04, CPK 6.3, CK 190, BNP 753 PT 20.5, INR 1.8, PTT 36.7, bilirubin 0.6, alkaline phosphatase 114, ALT 42, AST 52, albumin 3.7, lipase 376.   IMAGING DATA: Chest x-ray was obtained on the patient and findings were consistent with atelectasis of the left lung base as well as cardiomegaly. This appeared stable compared to 03/21/2012. There is no acute abnormality noted.   ASSESSMENT: 1.  Acute on chronic heart failure, systolic.  2.  Heme-positive stools.  3.  Urinary tract infection.  4.  Atrial fibrillation, currently on Coumadin therapy.  5.  Coronary artery disease with recent stent placement in January 2014, on Plavix.   PLAN: I have discussed this patient's case in detail with Dr. Lynnae Prudeobert Elliott, who is involved in the development of the patient's plan of care. At this time, his hemoglobin is within normal range and he has not noticed any overt gastrointestinal bleeding; therefore, we are comfortable with the patient resuming his Coumadin. We do recommend that he be maintained on pantoprazole therapy even after discharge for approximately 2 to 3 months. While inpatient,  I will also order a H. pylori blood test. We do recommend keeping a close eye on his hemoglobin and hematocrit throughout hospitalization. However, we would prefer for his cardiopulmonary status to be improved prior to proceeding with any invasive endoscopic intervention. Should he remain, stable, we are comfortable with him following up in the office to discuss undergoing intervention at that time. The above plan was discussed with the patient and patient's son, who verbalized understanding. All  questions were answered.   The above plan of care was discussed and agreed upon under supervisory agreement between myself and Dr. Lynnae Prudeobert Elliott.   Thank you so much of consultation and for allowing us to participate in the patient's plan of care.     ____________________________ Tristan ShackletonKaryn M. Colin BentonEarle, PA-C kme:cc D: 05/18/2012 16:44:34 ET T: 05/18/2012 17:16:39 ET JOB#: [id number]  cc: Tristan ShackletonKaryn M. Kinnedy Mongiello, PA-C, <Dictator> Tristan ShackletonKARYN M Terri Malerba PA ELECTRONICALLY SIGNED 05/19/2012 13:00

## 2014-06-21 NOTE — Discharge Summary (Signed)
PATIENT NAME:  Tristan Woods, Tristan Woods MR#:  [id number] DATE OF BIRTH:  19-Aug-1934  DATE OF ADMISSION:  05/18/2012 DATE OF DISCHARGE:  05/19/2012  DISCHARGE DIAGNOSES:  1. Acute systolic congestive heart failure.  2. Atrial fibrillation.  3. Hypertension. 4. Urinary tract infection. 5. Gastrointestinal bleed, possible polyp.   6. Acute renal insufficiency.   HISTORY OF PRESENTING ILLNESS: A 80 year old male with past medical history of hypertension, chronic atrial fibrillation, coronary artery disease, status post stent placement in January 2014, gout and cancer in the penis, was leading a healthy and active life, who complained of recurrent shortness of breath, and stress test was done, which was positive. As a result, he had cardiac catheterization and stent placed at the end of January, after which he was feeling fine, more short of breath and feeling strong and leading healthy life. Again started feeling short of breath and weak, swelling on the legs is also not going down anymore despite increased dose of Lasix by Dr. Gwen Pounds in the clinic, and so he decided to come to the Emergency Room. BNP was elevated. Stool guaiac on examination was positive for presence of the blood in the stool, as he was on Coumadin.   HOSPITAL COURSE AND STAY:  1. For his acute on chronic systolic heart failure, we advised to do echocardiogram, continued on IV Lasix b.i.d., and cardiology consult with Dr. Gwen Pounds was ordered. He had coronary artery disease and status post stent recently. We continued his cardiac medication while he was in the hospital except Coumadin, because he had questionable GI bleed. His INR level was 1.8, and so for his atrial fibrillation, we just maintained him without Coumadin while in the hospital and then restarted later on the discharge after GI consult. Cardiologist, Dr. Juliann Pares, saw the patient, and he suggested to continue Lasix, but decreased the dose, and add ACE inhibitor or ARB because  echocardiogram showed severely decreased left ventricular function. He advised no cath at this time and agreed with the continued management and can be discharged later on. The patient was feeling comfortable, not short of breath and feeling fine, so we decided to discharge from cardiology point of view.   Other medical issues addressed during this hospital stay:  2. GI bleed.  Dr. Mechele Collin did not want to do colonoscopy because the patient recently had cardiac cath and stent placement done, and so he wanted him to follow in the clinic, but to rule out any big mass in his colon, he did CT scan of the abdomen with oral contrast, which showed some diverticula or questionable polyp, and he advised [place] to continue Coumadin for now and follow in the clinic, and he will do colonoscopy in the future, but the patient definitely needed colonoscopy, and that was make sure that he understands the importance of that while getting discharged, to follow regularly with Dr. Mechele Collin.  3. Hypertension. Continued on Lasix and metoprolol.  4. Urinary tract infection. Urine culture: Gram-negative rods. Continued on Rocephin and discharged on cephalosporin orally.  5. Gout. We continued allopurinol.  6. Gastrointestinal bleeding. As mentioned above, no more GI bleed in the hospital. Hemoglobin stable, and CT of the abdomen showed questionable polyp or nodule and advised to follow for colonoscopy in clinic with Dr. Mechele Collin.  7. Atrial fibrillation. Coumadin was on hold while in the hospital, but then resumed as he needed it and no more GI bleed.  LABORATORY DATA: BNP is 753. Hemoglobin 14.4. Creatinine 1.15. Urinalysis is 98 WBCs, 3+ leukocyte  esterase, gram-negative rods in the urine sensitive to cephalosporin. INR was 1.8. Echocardiogram: Left ventricular ejection fraction less than 20%. Severely decreased global left ventricular systolic function. Multiple segmental Moderately increased left ventricular internal cavity size.  Severely enlarged right ventricle. Severely reduced right ventricular systolic function. Severely dilated left atrium. Severely dilated right atrium. Degenerative mitral valve, severe mitral valve regurgitation. Moderately elevated pulmonary systolic pressure. Moderate to severe tricuspid regurgitation.   CODE STATUS: Full code.   CONDITION ON DISCHARGE: Stable.   MEDICATION ON DISCHARGE: 1. Allopurinol 300 mg once a day.  2. Warfarin 2 mg once a day. 3. Plavix 75 mg once a day.  4. Atorvastatin 20 mg once a day.  5. Metoprolol 25 mg once a day. 6. Cefazolin 500 mg 2 times a day for 5 days.  7. Furosemide 20 mg once a day. 8. Pantoprazole 40 mg once a day. 9. Aldactone 25 mg once a day.  DIET ON DISCHARGE: Low-sodium, low-fat, low-cholesterol diet.   DIET CONSISTENCY: Regular.   TOTAL LIQUIDS: 1 liter per day.  FOLLOWUP: In 3 to 4 weeks with Dr. Mechele CollinElliott. Advised to follow with Dr. Gwen PoundsKowalski in 1 to 2 weeks.  TOTAL TIME SPENT: 45 minutes.    ____________________________ Hope PigeonVaibhavkumar G. Elisabeth PigeonVachhani, MD vgv:OSi D: 05/23/2012 09:11:45 ET T: 05/23/2012 09:36:10 ET JOB#: [id number]  cc: Hope PigeonVaibhavkumar G. Elisabeth PigeonVachhani, MD, <Dictator> Lamar BlinksBruce J. Kowalski, MD Scot Junobert T. Elliott, MD Jorje GuildGlenn R. Beckey DowningWillett, MD Altamese DillingVAIBHAVKUMAR Calleigh Lafontant MD ELECTRONICALLY SIGNED 06/01/2012 14:51

## 2014-06-21 NOTE — Discharge Summary (Signed)
PATIENT NAME:  Tristan Woods, Tristan Woods MR#:  [id number] DATE OF BIRTH:  [birth year]  DATE OF ADMISSION:  06/06/2012 DATE OF DISCHARGE:  06/08/2012  DISCHARGE DIAGNOSES: 1. Pneumonia.  2. Acute respiratory failure due to pneumonia.  3. Chronic systolic congestive heart failure.  4. Hypertension.  5. Atrial fibrillation.  6. Acute renal failure, improving.   CONDITION ON DISCHARGE: Stable.   CODE STATUS ON DISCHARGE: FULL CODE.   DISCHARGE MEDICATIONS: Allopurinol 300 mg oral tablet once a day, Plavix 75 mg oral tablet once a day, atorvastatin 20 mg oral tablet once a day, metoprolol 25 mg oral extended release once a day, pantoprazole 40 mg oral delayed-release tablet once a day, lisinopril 5 mg oral tablet once a day, warfarin 1 mg oral tablet 3 tablets orally once a day, Ceftin 500 mg oral tablet 2 times a day for 4 days, furosemide 20 mg oral tablet take 1 tablet orally once a day when weight is increased by 3 to 4 pounds, otherwise no need to take it.   HOME HEALTH ON DISCHARGE: No.   HOME OXYGEN: No.   DIET ON DISCHARGE: Low sodium, low fat, low cholesterol.  Diet consistency regular.   ACTIVITY LIMITATION: As tolerated.   TIMEFRAME FOR FOLLOW UP: In 1 to 2 weeks with his primary care physician to check INR level and renal function. Need to check INR in next week with primary care physician as Coumadin is increased.     PRIMARY CARE PHYSICIAN: Barry Brunner, MD at [facility].  HISTORY OF PRESENT ILLNESS: The patient is a 80 year old male with past medical history of hypertension, chronic atrial fibrillation, coronary artery disease status post stent placement in January 2014, gout, cancer in the penis, chronic systolic heart failure with ejection fraction of 20%, recent admission to the hospital for that. He said that after he was discharged 2 weeks ago he was slowly getting weaker and started feeling dizzy.  Lately now for the last few days, he  went to see his primary care  physician, and he continued his medication as we advised.  In the morning he woke up, he felt very weak and dizzy.  Whenever  he tried to stand up, he was very short of breath and he had cramps in his legs, so he decided to come to the Emergency Room. On further questioning, he denies any orthopnea, any edema of legs, any cough or palpitations or fever.   HOSPITAL COURSE AND STAY:  1. For acute respiratory failure, he was needing oxygen which he does not use at home.  Possibly they thought it might be pulmonary embolism initially when he was admitted, so we decided to do the deep vein thrombosis study on him which actually reported negative later on.  His V/Q scan was also negative, and so initially he was started on heparin for suspicion of pulmonary embolism, but then it was discontinued.  2. Possible atelectasis versus pneumonia: This was likely a community-acquired pneumonia.  White cell count was normal, but he had some cough.  There was no sputum production. He presented with shortness of breath, and chest x-ray was slightly suggestive of pneumonia, and so he was started on antibiotics for his pneumonia, and he had significant response within the next 2 days to the antibiotics, and he was comfortable breathing on room air.  3. Acute renal failure:  Presented with mild worsening of his kidney function. He was taking Lasix at home. We held it and gave gentle hydration.  His  renal function improved and so we discharged without any Lasix at home.  4. Congestive heart failure, chronic systolic failure:  Which was stable issue, and he did not have any symptoms of  exacerbation of CHF at home; so we stopped his Lasix and spironolactone due to renal failure.  We continued Lisinopril.  5. Atrial fibrillation:  He was taking Coumadin. INR was low.  He was taking 2 mg, so we increased to 3 mg daily and advised to follow his INR  level with his primary care physician within the next week.  6. Coronary artery  disease:  Bare Metal Stent placement.  So we continued aspirin, metoprolol and statin.  7. Hypertension: Continued metoprolol and Lisinopril.  8. Gout: We held it due to renal failure and restarted on discharge.  9. Skin rashes:  He had this complaint since the last 2 months.  I called infectious disease  consult with Dr. Sampson GoonFitzgerald.  He ordered multiple tests.  This is not an active or bothersome some issue, and the patient will follow with him in the clinic.   HOSPITAL CONSULTS:  Dr. Sampson GoonFitzgerald for infectious disease.     LABORATORY AND RADIOLOGICAL DATA:  WBC 9.4, hemoglobin 14, platelet count 215, MCV 91.  BUN 41, creatinine 1.56, potassium 4.6, chloride 99, troponin 0.03, magnesium level 1.8. Thyroid stimulating hormone 524, INR 1.5 on presentation, BNP 794. D-dimer 1.22. Urinalysis was negative.  ABG on presentation:  pH 7.45, pCO2 of 34, pO2 98 on 28% oxygen supplementation. V/Q scan negative, creatinine improved to 1.37 after the IV fluid.   Chest x-ray on presentation was reported as suggestive of subsegmental atelectasis in the left lower lobe, posteriorly in the right middle lobe. There was no evidence of CHF. Doppler study of bilateral lower extremities were negative for any clots. Chest x-ray repeated one on the next day after hydration suggested some low-grade compensated CHF, and there is new atelectasis in the left lung base; and on  repeat, creatinine level came down to 1.33.  Repeat chest x-ray on the 10th of April showed dominant cardiomegaly but no CHF. No acute pulmonary disease noted.   TOTAL TIME SPENT ON THIS DISCHARGE SUMMARY: 45 minutes.  ____________________________ Hope PigeonVaibhavkumar G. Elisabeth PigeonVachhani, MD vgv:cb D: 06/11/2012 21:15:39 ET T: 06/11/2012 22:22:59 ET JOB#: [id number]  cc: Hope PigeonVaibhavkumar G. Elisabeth PigeonVachhani, MD, <Dictator> Jorje GuildGlenn R. Beckey DowningWillett, MD Serita ShellerErnest B. Maryellen PileEason, MD Stann Mainlandavid P. Sampson GoonFitzgerald, MD  Heath GoldVAIBHAVKUMAR [facility] CenterVACHHANI MD ELECTRONICALLY SIGNED 06/13/2012 16:27

## 2014-06-21 NOTE — Consult Note (Signed)
PATIENT NAME:  Tristan Woods, Theophil M MR#:  [id number] DATE OF BIRTH:  Jul 04, 1934  DATE OF CONSULTATION:  06/07/2012  REFERRING PHYSICIAN:  Hope PigeonVaibhavkumar G. Elisabeth PigeonVachhani, MD CONSULTING PHYSICIAN:  Stann Mainlandavid P. Sampson GoonFitzgerald, MD  PRIMARY CARE PHYSICIAN: Jorje GuildGlenn R. Beckey DowningWillett, MD  REASON FOR CONSULTATION: Fever.   HISTORY OF PRESENT ILLNESS: This is a very pleasant 80 year old gentleman with a history of atrial fibrillation, hypertension, CHF, gout and GERD, who has had several recent admissions for CHF exacerbations. He has an EF of 20%. He was readmitted yesterday after dizziness and weakness and shortness of breath. He is being treated currently for possible pneumonia as well as his AFib, CHF and worsening renal failure. We are consulted for evaluation of this rash he has had.   The patient reports that he has slowly developed a rash over the last 3 months. He describes the lesions as starting flat, pink, round lesions that are nonpruritic and nontender except for very mild tenderness that lets him know that he is developing one. They then develop into what sound like blisters with clear fluid clear. The clear fluid drains. The lesions then slowly heal. Associated with this, he has had no fevers, night sweats, chills or weight loss. He reports some weight gain. He has no arthritic symptoms. The lesions are occurring on his lower extremities, upper extremities, chest and trunk. He denies any new medications that he noted associated with the development of this rash. He denies any new lotions or other possible creams or detergents. His only other symptoms have the pulmonary symptoms as well as the lower extremity swelling and the dizziness.   PAST MEDICAL HISTORY:  1. CHF, EF of 20%.  2. Atrial fibrillation.  3. Coronary artery disease, status post bare-metal stent in 2014.  4. Hypertension.  5. Gout.  6. History of penile cancer status post surgery.  7. GERD.  PAST SURGICAL HISTORY:  1. Heart cath.  2. Surgical  removal of penile cancer.   SOCIAL HISTORY: The patient does not smoke or drink. He lives with his wife. No drug use. He retired from [employer]. He has no travel. He has 2 dogs at home. Denies any recent tick bites. No international travel. His daughter and grandchildren live next door, but they are not young. There have been no other sick contacts.   MEDICATIONS: Currently, the patient is on atorvastatin, azithromycin, ceftriaxone, lisinopril, metoprolol, pantoprazole, aspirin, warfarin, Tylenol p.r.n., Flexeril p.r.n., Zofran p.r.n.   ALLERGIES: The patient has no known drug allergies.   REVIEW OF SYSTEMS: Eleven systems reviewed and negative except as per HPI.   PHYSICAL EXAMINATION:  VITALS: T-max since admission is 98.4, pulse 101 to 133, blood pressure 92/47 up to 110/78, pulse oximetry 96% to 98% on 2 liters.  GENERAL: He is an overweight but pleasant interactive gentleman. He is lying in bed with no acute distress except for some mild dyspnea.  HEENT: His pupils are equal, round and reactive to light and accommodation. Extraocular movements are intact. Sclera anicteric. His oropharynx is clear. He has no oral lesions or tonsillomegaly.  NECK: Supple.  HEART: Irregular with distant heart sounds.  LUNGS: Clear to auscultation bilaterally.  ABDOMEN: Obese, but soft, nontender, nondistended.  EXTREMITIES: He does have 1+ bilateral lower extremity edema.  SKIN: He has innumerable 1 to 2 cm, round, slightly raised, salmon-colored to pink-colored lesions on his lower extremities up to his groin as well as his abdomen, his back and his arms. These are slightly blanching. A few of  them have some mild crusts over them. None of them are vesicular in nature at this point, but look like they could have been denuded vesicles. He also has, on his nose, a poorly healed opening that he says has been there for many years. It is like a V-shaped laceration. 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   DATA: White blood count 9.5, hemoglobin 13.0, platelets 189. Monocyte count slightly elevated at 1.2, but eosinophils are 0.3 and normal. Comprehensive panel shows a BUN of 43, creatinine of 1.37, down from 1.56 yesterday. LFTs are within normal limits except for slight elevation in AST to 42 on admission. CK is normal at 148, troponins are negative. TSH slightly elevated at 5.24. D-dimer was positive at 1.72. Urinalysis was unremarkable. Reviewing prior CBCs, I see no elevated eosinophils. Of note, on March 20th, he had a Morganella urine culture, greater than 100,000.   IMAGING: Chest x-ray shows low-grade CHF and new atelectasis at the left lung base.   IMPRESSION:  1. A 80 year old with multiple medical problems admitted for congestive heart failure exacerbation and atrial fibrillation, with 3 months of progressive lesions on his body that are relatively macular in nature, but at some point may be vesicular with draining some clear fluid. He does have a history of shingles. He does not seem to have any other reason for profound immunosuppression, and so I doubt this would be disseminated shingles since that would be very unusual. Also, they are not tender, and he is not febrile. I see no other evidence of systemic infection at this point. Differential is broad, but would include potentially drug reaction, although he denies any new drugs. He has no recent tick bites or other lesions that make me concerned that he has any kind of tickborne or insect-borne illness. I do not feel this is likely an infectious etiology and more likely a dermatological noninfectious condition. However, at this point, we will check an RPR. If his lesions blister, I will swab them for HSV and VZV, although again, I think this is unlikely.  2. I would suggest he be seen by dermatology, although this can likely wait until outpatient. He would likely need biopsy of this lesion.  3. Thank you for the consult. I will  be glad to follow with you.    ____________________________ Stann Mainland. Sampson Goon, MD dpf:OSi D: 06/07/2012 12:33:05 ET T: 06/07/2012 13:21:28 ET JOB#: [id number]  cc: Stann Mainland. Sampson Goon, MD, <Dictator> Jenina Moening Sampson Goon MD ELECTRONICALLY SIGNED 06/07/2012 23:28

## 2014-06-21 NOTE — Consult Note (Signed)
Pt with CC of heme positive stool, admitted for SOB with CHF history, improved today.  He actually has never had a colonoscopy, he had prostate bx per rectum and thought this was the colon exam.  He needs some evaluation BUT he had a stent placed in January this year.  I recommend a CT with oral contrast only to check for any large colon masses that might cause heme positive stool.  If it is neg then we can wait a few months to do the GI evaluation off anti platelet meds.  Electronic Signatures: Scot JunElliott, Karena Kinker T (MD)  (Signed on 20-Mar-14 18:27)  Authored  Last Updated: 20-Mar-14 18:27 by Scot JunElliott, Terie Lear T (MD)

## 2014-06-21 NOTE — Consult Note (Signed)
Brief Consult Note: Diagnosis: AFIB/CHF/Edema/Heme + Stools/S/P BMS 1/14.   Patient was seen by consultant.   Consult note dictated.   Recommend further assessment or treatment.   Orders entered.   Discussed with Attending MD.   Comments: IMP AFIB CHF Edema SOB LGI bleeding CAD S/P BMS 1/14 . PLAN Hold coumadin for now F/U H/H Agree with GI input Continue Lasix but reduce dose May treat with ACE/ARB for HF  ECHO with severely depressed LVF I do not rec cath at this point Agree with CT first of ABD He should be an acceptable scope risk if GI feels that it is necessary It should be resonably save to stop plavix if bleeding continues I will dicuss with BK.  Electronic Signatures: Dorothyann Pengallwood, Santiago Stenzel D (MD)  (Signed 21-Mar-14 07:09)  Authored: Brief Consult Note   Last Updated: 21-Mar-14 07:09 by Dorothyann Pengallwood, Neira Bentsen D (MD)

## 2014-06-21 NOTE — Consult Note (Signed)
PATIENT NAME:  Tristan Woods, Tristan Woods MR#:  [id number] DATE OF BIRTH:  [birth year]  DATE OF CONSULTATION:  03/22/2012  REFERRING PHYSICIAN:  Enedina Finner. Manson Passey, MD CONSULTING PHYSICIAN:  Starleen Arms, MD  PRIMARY CARE PHYSICIAN: Jorje Guild. Beckey Downing, MD  CARDIOLOGY: Lamar Blinks, MD   CHIEF COMPLAINT: Shortness of breath.   HISTORY OF PRESENT ILLNESS: This is a 80 year old male with known history of CHF, AFib, on anticoagulation, hypertension, gout, arthritis, history of penile cancer, who presents with complaint of shortness of breath. The patient is followed by Dr. Gwen Pounds, last time seen by him on 03/17/2012, due to complaints of shortness of breath, where he is planned to have cardiac catheterization on January 28th, as he is having systolic CHF with ejection fraction of 30% on echo done on 03/13/2012. The patient reports he had complaints of shortness of breath that started 2 hours prior to arrival, where he received 40 of IV Lasix, and the patient had significant diuresis after that, where the patient reports resolution of his symptoms. The patient reports he has no further shortness of breath, and currently he is saturating 97% on room air. The patient denies any chest pain, any palpitations, any fever, chills, productive sputum of cough. The patient reports he had some mild ankle swelling prior to presentation. The patient's chest x-ray did not show any volume overload. His proBNP is significantly reduced from previous, where it is currently 539, decreased from 1529. The patient denies any chest pain or chest tightness. The patient had negative troponins and did not have any significant EKG changes. The patient reports that he currently walked a few times to the bathroom without any shortness of breath. I discussed with the patient, where he prefers to go home and to follow with Dr. Gwen Pounds as an outpatient at his previously scheduled appointment to do of the cardiac catheterization.   PAST  MEDICAL HISTORY:  1. Atrial fibrillation.  2. Hypertension.  3. Gout.  4. Arthritis.  5. History of penile cancer.  6. Systolic CHF with last echo on January 13th showing ejection fraction of 30%.   PAST SURGICAL HISTORY:  1. History of cardiac catheterization, but no stent placement or angioplasty.  2. Surgical removal of penile cancer.   ALLERGIES: No known drug allergies.   HOME MEDICATIONS:  1. Allopurinol 300 mg daily.  2. Ferrous sulfate 325 mg oral daily.  3. Metoprolol tartrate 50 mg oral daily.  4. Warfarin 2 mg oral daily.  5. Lasix 20 mg oral daily.   FAMILY HISTORY: Significant for hypertension.   SOCIAL HISTORY: Denies any tobacco, alcohol or illicit drug use.   REVIEW OF SYSTEMS:  CONSTITUTIONAL: Denies any fever, chills, fatigue or weakness.  EYES: Denies blurry vision, double vision, inflammation or glaucoma.  ENT: Denies tinnitus, ear pain, hearing loss, epistaxis or discharge.  RESPIRATORY: Denies cough, wheezing, hemoptysis, painful respirations or COPD. He complains of dyspnea on exertion, currently resolved.  CARDIOVASCULAR: Denies any chest pain, orthopnea, arrhythmia, palpitations, syncope, lightheadedness. Had some mild ankle edema.  GASTROINTESTINAL: Denies any nausea, vomiting, diarrhea, abdominal pain, hematemesis, melena, jaundice.  GENITOURINARY: Denies dysuria, hematuria, renal colic.  ENDOCRINE: Denies polyuria, polydipsia, heat or cold intolerance. Had significant diuresis after the IV Lasix.  HEMATOLOGY: Denies anemia, easy bruising, bleeding diathesis. On anticoagulation for AFib. INTEGUMENTARY: Denies acne, rash. Has skin lesions on the nose growing over the last few months.  MUSCULOSKELETAL: Denies any cramps, limited activity, redness. Has history of gout and arthritis.  NEUROLOGIC: Denies CVA,  TIA, seizures, memory loss, dementia, headache, ataxia, vertigo.  PSYCHIATRIC: Denies anxiety, insomnia, bipolar disorder, depression.   PHYSICAL  EXAMINATION:  VITAL SIGNS: Temperature 97.5, pulse 101, respiratory 22, blood pressure 142/92, saturating 98% on oxygen, and on room air 97%.  GENERAL: Elderly male, looks comfortable, in no apparent distress.  HEENT: Head atraumatic, normocephalic. Pupils equally reactive to light. Pink conjunctivae. Anicteric sclerae. Moist oral mucosa.  NECK: Supple. No thyromegaly. No JVD.  CHEST: Good air entry bilaterally. No wheezing, rales or rhonchi.  CARDIOVASCULAR: S1, S2 heard. No rubs, murmurs or gallops. Irregularly irregular rhythm.  ABDOMEN: Soft, nontender, nondistended. Bowel sounds present.  EXTREMITIES: No edema. No clubbing. No cyanosis.  PSYCHIATRIC: Pleasant, appropriate affect. Awake and alert x3. Intact judgment and insight. Cranial nerves grossly intact. Motor 5 out of 5, nonfocal.  SKIN: The patient has skin lesions on the nose, one on the right lateral side and one over the nose bridge.   PERTINENT LABORATORY: Glucose 103, proBNP 539, BUN 28, creatinine 1.34, sodium 137, potassium 4.4, chloride 104, CO2 29. Troponin less than 0.02. White blood cells 13, hemoglobin 14.2, hematocrit 44.4, platelets 294.   ASSESSMENT AND PLAN:  1. Congestive heart failure, systolic, chronic. The patient is known to have history of congestive heart failure, systolic, with ejection fraction of 30%. Currently, the patient is not hypoxic, chest x-ray does not show any evidence of volume overload, his proBNP is better than his baseline, has no lower extremity edema, saturating very well on room air, so the patient can be discharged home from ED with instructions to increase his Lasix from 20 to 40 mg oral daily and to restrict salt intake. The patient was instructed to return to the ED if any of his symptoms recurs. The patient was instructed to proceed with Dr. Gwen PoundsKowalski as planned, where he is planned for cardiac catheterization on the 28th. The patient reports he was instructed to hold his warfarin on the 23rd  and was instructed to follow as Dr. Gwen PoundsKowalski instructed the patient.  2. Skin lesions of the nose. The patient was told that there is a suspicion for cancerous lesion, and he was instructed to follow with Dr. Beckey DowningWillett and to schedule an appointment with dermatology. The patient and wife, who is at bedside, express their understanding, and wife reports she will schedule him with her dermatology physician.  3. Atrial fibrillation. Currently, the patient is on anticoagulation. Heart rate is acceptable. Instructed to continue his metoprolol and anticoagulation.  4. Gout. Continue with allopurinol. Currently, no active complaints.  5. Anemia, appears to be much improved.  6. Hypertension. Blood pressure is acceptable.   TOTAL TIME SPENT ON PATIENT CARE: 45 minutes.    ____________________________ Starleen Armsawood S. Ranier Coach, MD dse:OSi D: 03/22/2012 03:32:29 ET T: 03/22/2012 07:15:17 ET JOB#: [id number]  cc: Starleen Armsawood S. Traveon Louro, MD, <Dictator> Zelta Enfield Teena IraniS Ranell Finelli MD ELECTRONICALLY SIGNED 03/23/2012 0:26

## 2014-06-21 NOTE — H&P (Signed)
PATIENT NAME:  ONTARIO, PETTENGILL MR#:  [id number] DATE OF BIRTH:  November 16, 1934  DATE OF ADMISSION:  06/06/2012  PRIMARY CARE PHYSICIAN: Sherrine Maples R. Beckey Downing, MD  PRIMARY CARDIOLOGIST: Lamar Blinks, MD  REFERRING EMERGENCY ROOM PHYSICIAN: Humberto Leep. Margarita Grizzle, MD  CHIEF COMPLAINT: Shortness of breath.   HISTORY OF PRESENT ILLNESS: A 80 year old male with a past medical history of hypertension, chronic atrial fibrillation, coronary artery disease, status post stent placement in January 2014, Gout, cancer in the penis, chronic systolic heart failure with ejection fraction of 20%m recent admission to hospital for that. He said that after he was discharged 2 weeks ago, he was slowly getting weaker and started feeling dizzy lately now the last few days. He went to see his primary care physician and he continued his medications as we advised. Today morning when he woke up, he felt very weak and dizzy whenever he tried to stand up. He was very short of breath and he had cramps in his legs, so he decided to come to the Emergency Room. On further questioning, he denies any orthopnea, any edema on the legs, any cough, palpitations or fever.  REVIEW OF SYSTEMS:    CONSTITUTIONAL: No fever, fatigue, weakness, pain or weight loss.  EYES: No blurring, double vision or redness.  EARS, NOSE, THROAT: No tinnitus, ear pain or hearing loss.  RESPIRATORY: No cough, wheezing, hemoptysis or dyspnea.  CARDIOVASCULAR: No chest pain, orthopnea, edema or arrhythmia.  GASTROINTESTINAL: No nausea, vomiting, diarrhea or abdominal pain  GENITOURINARY: No dysuria, hematuria or increased frequency.  ENDOCRINE: No increased urination or heat or cold intolerance.  SKIN: Has some rashes on the skin for the last 2 months, which turn to blisters and then after that they pop, some fluid comes out and then it starts itching and then it heals. Received some antibiotic for that by his primary care, but no help.  MUSCULOSKELETAL: He does not  have any swelling or tenderness of the joints, but has leg cramps in the calf muscles.  NEUROLOGICAL: No numbness, weakness or headache, but has dizziness, especially trying to stand up any time.  PSYCHIATRIC: No anxiety, insomnia or depression.   PAST MEDICAL HISTORY: Coronary artery disease, status post bare metal stent in January 2014, hypertension, chronic atrial fibrillation on Coumadin, chronic systolic congestive heart failure with ejection fraction of 20%, gout.   PAST SURGICAL HISTORY: Partial penile resection due to cancer, status post chemoradiation for cancer.   SOCIAL HISTORY: No smoking. No drinking. No drug use. Lives with his wife, fully independent in his day to day activities.   HOME MEDICATIONS: As per discharge from last time 2 weeks ago, he continued taking the same: Allopurinol 300 mg once a day, warfarin 2 mg once a day, Plavix 75 mg once a day, atorvastatin 20 mg once a day, metoprolol 25 mg once a day, cefuroxime 500 mg 2 times a day which he finished taking after 5 days of discharge, furosemide 20 mg oral tablet once a day, pantoprazole 40 mg delayed-release tablet once a day, Aldactone 25 mg once a day and lisinopril 5 mg once a day   FAMILY HISTORY: No hypertension or diabetes in the family.   PHYSICAL EXAMINATION:  VITAL SIGNS: Temperature 97.3. Pulse rate 103, which was running 120 to 130 when he was in ER. Blood pressure 108/75. Pulse oximetry 89% on room air, came up to 95% with 2 liters nasal cannula supplementation.  GENERAL: He is fully alert, oriented to time, place and person,  in mild distress due to leg cramps and shortness of breath.  HEENT: Head and neck atraumatic. Conjunctivae pink.  NECK: Supple. No JVD.  RESPIRATORY: Bilateral clear and equal air entry.  CARDIOVASCULAR: S1, S2 present, regular. No murmur.  ABDOMEN: Soft, nontender. Bowel sounds present. No organomegaly.  SKIN: Has multiple small, 5 to 6 mm size, red rashes which are blanching on  gentle pressure. Not any secretions or scratch marks present.  JOINTS: No swelling or tenderness.  EXTREMITIES: Legs: No edema.   NEUROLOGICAL: Power 5/5. Follows commands. No tremors. No calf tenderness.  LABORATORY DATA: Glucose 66. BNP 794. BUN 41, creatinine 1.56, sodium 133, potassium 4.6, chloride 99, CO2 of 30, calcium 9.2, total protein 8, albumin 4.0, bilirubin 1.0, alkaline phosphatase 115, SGOT 42, SGPT 35. Troponin 0.03. TSH 5.24. WBC 9.4, hemoglobin 14 and platelet count 215. INR 1.5. D-dimer 1.72. Urinalysis grossly negative. ABG on 28% FiO2 with pH of 7.45, pCO2 of 34 and pO2 of 98. In ER initially, CT of the head was done which showed no acute abnormality. Chest x-ray PA and lateral was done which showed segmental atelectasis in left lower lobe, posteriorly in right middle lobe. No evidence of CHF. V/Q scan is done which shows low probability of acute pulmonary embolism. Bilateral lower extremity DVT studies are done which are negative.   ASSESSMENT AND PLAN: This 80 year old male with multiple past medical history and recurrent hospital admissions for shortness of breath and congestive heart failure exacerbation presented to Emergency Room with worsening shortness of breath, feeling dizzy, and leg cramps. Found having mild dehydration and nontherapeutic INR. 1.  Acute respiratory failure needing oxygen, which he does not use at home: Possibly secondary to pulmonary embolism as thought initially when admitting him but later on, V/Q scan and deep vein thrombosis studies are done and they are negative. CT of chest with angiogram cannot be done because of worsening renal function, but negative V/Q scan and deep vein thrombosis study rules out. Initially he was started on heparin drip by me in morning but later on after following the studies, I discontinued heparin drip.  2.  Possible atelectasis versus pneumonia: This is likely as having community-acquired pneumonia. His white cell count is  normal but he had some cough, dry, no sputum production. He presented with shortness of breath. Currently we will treat him with antibiotics. As he is dehydrated, x-ray picture might not be that much suggestive of having clear pneumonia. Will treat with antibiotics and repeat the x-ray to see any new appearance of more atelectasis or infiltrate.  3.  Acute worsening of renal function: Possibly this is due to dehydration secondary to diuretic use for heart failure. Currently he is not in heart failure, so we will stop his Lasix and spironolactone and will give him gentle hydration 500 mL and repeat the renal function tomorrow morning.  4.  Atrial fibrillation: He was taking Coumadin. INR is subtherapeutic. We initially started him on heparin drip, but now stopped. I will start him with Coumadin 3 mg, as 2 mg does not seem to be effective very well.  5.  Congestive heart failure, chronic systolic: Ejection fraction 20% with recent echo 3 weeks ago. Will continue lisinopril. Hold Lasix and spironolactone. 6.  Coronary artery disease, status post stent: Will continue statin and metoprolol. He has metallic stent so will give him aspirin.  7.  Hypertension: Currently blood pressure is running on the lower normal side and the patient says he is feeling dizzy every  time he tries to stand up. We will check orthostatic vitals and possibly because he is overall dehydrated secondary to diuretic use, we will hold diuretics and just give minimal antihypertensive medications at this time  8.  Gout: Resume allopurinol. 9.  Rashes on the skin: He was supposed to see Dr. Lurene Shadowfitzerald in the clinic. I called him to come and see in the hospital and will follow.   CODE STATUS: Full code.   TOTAL TIME SPENT: 50 minutes.    ____________________________ Hope PigeonVaibhavkumar G. Elisabeth PigeonVachhani, MD vgv:jm D: 06/06/2012 17:59:14 ET T: 06/06/2012 19:14:24 ET JOB#: [id number]  cc: Hope PigeonVaibhavkumar G. Elisabeth PigeonVachhani, MD, <Dictator> Altamese DillingVAIBHAVKUMAR Marney Treloar  MD ELECTRONICALLY SIGNED 06/11/2012 22:22

## 2014-06-21 NOTE — Consult Note (Signed)
Chief Complaint:  Subjective/Chief Complaint SOB. Patient states he has been feeling overall well since admission. No further SOB, weakness. No chest pain. He denies any abdominal pain, rectal pain, BRBPR, melena. No lightheadedness/dizziness. Tolerating regular diet without difficulty. No current symptomatic concerns or complaints, pt states he would like to go home today.   Brief Assessment:  Cardiac Irregular   Respiratory normal resp effort  clear BS   Gastrointestinal Normal   Gastrointestinal details normal Soft  Nontender  Nondistended  No masses palpable  Bowel sounds normal  No gaurding   Additional Physical Exam a&o x3. mucus membranes moist. Mild LE edema.   Assessment/Plan:  Assessment/Plan:  Assessment 1. SOB; acute on chronic heart failure, systolic 2. CAD, recent cardiac stent placed in Jan 2014- bare metal. 3. Heme + stool 4. UTI, on recphin 5. hx Penile CA, s/p surgery, chemo/radiation.   Plan CT abdomen/pelvis with oral contrast reviewed. possible 6mm colonic polyp vs fecal material. Also some proctitis noted, which may explain heme + stool. Will review CT with Dr Mechele CollinElliott, but findings certainly warrant a colonoscopy. Will need to consider the fact that he is on blood thinners (plavix and coumadin) and make adjustements as needed prior to endoscopic intervention. Recent bare metal stent placement will need to be taken into account. This was discussed with the patient, and he understands his need for a colonoscopy and agrees to proceed when medically feasible. All questions answered. Likely can f/u as outpatient for this procedure. Further inpt reccs per IM and cardiology.   Electronic Signatures: Ashok Cordiaarle, Peterson Mathey M (PA-C)  (Signed 21-Mar-14 13:27)  Authored: Chief Complaint, Brief Assessment, Assessment/Plan   Last Updated: 21-Mar-14 13:27 by Ashok CordiaEarle, Caelyn Route M (PA-C)

## 2014-06-21 NOTE — H&P (Signed)
PATIENT NAME:  Tristan Woods, Tristan Woods MR#:  [id number] DATE OF BIRTH:  [birth year]  DATE OF ADMISSION:  05/18/2012  PRIMARY CARE PHYSICIAN:  Dr. Barry BrunnerGlenn Willett.   PRIMARY CARDIOLOGIST:  Dr. Arnoldo HookerBruce Kowalski.   REFERRING EMERGENCY ROOM PHYSICIAN:  Dr. Dorothea GlassmanPaul Malinda.   CHIEF COMPLAINT:  Shortness of breath.   HISTORY OF PRESENTING ILLNESS: A 80 year old male with past medical history of hypertension, chronic atrial fibrillation, coronary artery disease status post stent placement, January 2014; gout and cancer in the penis. Was leading a healthy and active life at home, and in January, he had complaints of recurrent shortness of breath, and stress test was done, which was positive, and as a result, he had cardiac cath done and stent placed in the end of January. After that, he was feeling fine. No more short of breath and feeling strong and leading healthy life, but for the last 2 to 3 days, he had again started feeling short of breath and feeling weak. He also noticed his swelling on the legs is also not going down, despite of increased dose by Dr. Gwen PoundsKowalski in last week's clinic visit of his Lasix tablet. He also complained that since the last few months, he has not been able to sleep flat in the bed, and it makes him more comfortable while he is sitting up, and his head is elevated over the level of his body. He felt a little better after the cardiac catheterization and stent placement was done, but now again worsening. He is not able to sleep properly at night because of this complaint. On further questioning, he denies any cough, fever, sick contacts, increased oral intake of liquids or salt. No palpitations or chest pain. Because of this, he decided to come to Emergency Room. On initial workup in ER, he was found to have urinary tract infection. Also, his BNP was elevated and on stool guaiac examination, he was positive for the presence of blood in the stool while he is on Coumadin.    REVIEW OF SYSTEMS:    CONSTITUTIONAL:  No fever, fatigue, but having generalized weakness. No weight loss or weight gain.  EYES:  No blurring, double vision, pain or redness.  EARS, NOSE, THROAT:  No tinnitus, ear pain, hearing loss.  RESPIRATORY:  No cough, wheezing, hemoptysis, but has shortness of breath.  CARDIOVASCULAR:  No chest pain, palpitations, syncopal episode or dizziness, but he has orthopnea and edema.  GASTROINTESTINAL:  No nausea, vomiting, diarrhea, abdominal pain. He did not notice, on his own, any blood in his vomit or stool.  GENITOURINARY:  No dysuria, hematuria or increased frequency or change in the color or smell of the urine  ENDOCRINE:  No nocturia, heat or cold intolerance.  SKIN:  No rashes and no change in color of the skin.  MUSCULOSKELETAL:  No pain or swelling in the joints.  NEUROLOGICAL:  No numbness, weakness, tremor, vertigo or headache.  PSYCHIATRIC:  No anxiety, insomnia, bipolar disorder.   PAST MEDICAL HISTORY:   1.  Coronary artery disease status post bare metal stent, January 2014.  2.  Hypertension.  3.  Chronic atrial fibrillation, on Coumadin.  4.  Gout.   PAST SURGICAL HISTORY:  Partial penectomy due to penile cancer, status post chemoradiation for penis surgery and cancer.   SOCIAL HISTORY:  No smoking, no drinking and no drug use. Lives with his wife, fully independent in his day to day activities.   HOME MEDICATIONS:   1.  Metoprolol succinate 50 mg  daily.  2.  Coumadin 2 mg daily.  3.  Allopurinol 300 mg p.o. daily.  4.  Plavix 75 mg daily.  5.  Lasix 80 mg daily.   FAMILY HISTORY:  No hypertension or diabetes.   PHYSICAL EXAMINATION: VITAL SIGNS:  In the ER, temperature 98.1, pulse rate 111, respirations 18 and blood pressure 128/88. Pulse ox 95 on room air.  GENERAL:  Fully alert, oriented to time, place and person, and he is not in any acute distress.  HEENT:  Head and neck atraumatic. Conjunctivae pink. Oral mucosa moist.  NECK:  Supple. Jugular  venous distention present up to angle of the mandible.  CARDIOVASCULAR:  S1, S2 present, irregular. No murmur. RESPIRATORY:  Bilateral equal air entry, few crepitations.  ABDOMEN:  Soft, nontender, obese. Bowel sounds normal.  SKIN:  No rashes.  JOINTS: No swelling or tenderness.  EXTREMITIES:  Mild edema around the ankle.  NEUROLOGICAL:  Power 5/5. No tremors. Follows commands.  PSYCHIATRIC: Does not appear in any acute psychiatric illness at this time.   LABORATORY DATA, DIAGNOSTICS, RADIOLOGIC:  BNP 753. Glucose 108, BUN 41, creatinine 1.15, sodium 138, potassium 3.8, chloride 100, CO2 of 31. Total protein 8.1, albumin 3.7, bilirubin 0.6, alkaline phosphatase 114, SGOT 52, SGPT 42. CK-MB 6.3, troponin 0.04 on followup x 2. WBC 9.9, hemoglobin 14.4, platelet count 241. Prothrombin time 20.5, INR 1.8.  Urinalysis: Yellow, hazy 3+ leukocyte esterase and WBCs 98 with 2 RBCs.  Chest x-ray: Atelectasis of the lung. Cardiomegaly. Chest is stable. EKG: Atrial fibrillation with ventricular response up to 107.   ASSESSMENT AND PLAN:  A 80 year old male with history of coronary artery disease status post recent stent, atrial fibrillation, hypertension, came with worsening shortness of breath for the last few days, also noticed having urinary tract infection and blood in the stool by ER.  1.  Acute on chronic systolic heart failure. We do not have echocardiogram in our records, but Dr. Philemon Kingdom note last time, discharge summary, mentions that systolic heart failure as noticed on cardiac catheter. We will give him Lasix IV 40 mg b.i.d., measure intake and output, and get echocardiogram, cardiology consult with Dr. Gwen Pounds for further management.  2.  Coronary artery disease status post stent. Continue beta blocker, Plavix. No aspirin, as he was taking Coumadin. Continue statin.  3.  Atrial fibrillation, on Coumadin. INR is 1.8. Currently, he has some blood in the stool. His hemoglobin is stable. We will  hold Coumadin and call GI consult and decide further planning. If hemoglobin remains stable and no more bleeding, we might restart it tomorrow.  4.  Gastrointestinal bleed, possibly lower, might be diverticulosis or hemorrhoidal bleed. Hemoglobin is stable, and the patient says he never noticed any blood in the stool while at home. This might be an incidental finding. We will follow his hemoglobin 12 hourly, and if it does not drop, we will restart Coumadin. We will also wait for GI input on this issue.  5.  Hypertension. Furosemide IV, metoprolol oral.  6.  Urinary tract infection. Urine culture. Rocephin IV daily.  7.  Gout. Resume allopurinol  8.  Deep vein thrombosis prophylaxis with elastic hose and compression devise, as his INR is 1.8, and he has blood in the stool, so we will not give any chemical anticoagulant.  9.  Gastrointestinal prophylaxis. We will give pantoprazole orally every morning.   TOTAL TIME SPENT: 50 minutes.   CODE STATUS: FULL CODE.    ____________________________ Hope Pigeon Elisabeth Pigeon, MD  vgv:dmm D: 05/18/2012 10:17:00 ET T: 05/18/2012 10:56:00 ET JOB#: [id number]  cc: Hope Pigeon. Elisabeth Pigeon, MD, <Dictator> Altamese Dilling MD ELECTRONICALLY SIGNED 06/01/2012 14:50
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
## 2018-09-30 DEATH — deceased
